# Patient Record
Sex: Female | Born: 1965 | Race: White | Hispanic: No | Marital: Married | Smoking: Never smoker
Health system: Southern US, Community
[De-identification: ages and names within clinical notes are randomized; demographics above are authoritative.]

## PROBLEM LIST (undated history)

## (undated) DIAGNOSIS — H409 Unspecified glaucoma: Secondary | ICD-10-CM

## (undated) DIAGNOSIS — G4730 Sleep apnea, unspecified: Secondary | ICD-10-CM

## (undated) DIAGNOSIS — K589 Irritable bowel syndrome without diarrhea: Secondary | ICD-10-CM

## (undated) DIAGNOSIS — J309 Allergic rhinitis, unspecified: Secondary | ICD-10-CM

## (undated) DIAGNOSIS — E119 Type 2 diabetes mellitus without complications: Secondary | ICD-10-CM

## (undated) DIAGNOSIS — S83209A Unspecified tear of unspecified meniscus, current injury, unspecified knee, initial encounter: Secondary | ICD-10-CM

## (undated) DIAGNOSIS — Z1371 Encounter for nonprocreative screening for genetic disease carrier status: Secondary | ICD-10-CM

## (undated) DIAGNOSIS — I10 Essential (primary) hypertension: Secondary | ICD-10-CM

## (undated) DIAGNOSIS — M1990 Unspecified osteoarthritis, unspecified site: Secondary | ICD-10-CM

## (undated) DIAGNOSIS — T8859XA Other complications of anesthesia, initial encounter: Secondary | ICD-10-CM

## (undated) DIAGNOSIS — R928 Other abnormal and inconclusive findings on diagnostic imaging of breast: Secondary | ICD-10-CM

## (undated) DIAGNOSIS — D131 Benign neoplasm of stomach: Secondary | ICD-10-CM

## (undated) DIAGNOSIS — Z9889 Other specified postprocedural states: Secondary | ICD-10-CM

## (undated) DIAGNOSIS — R112 Nausea with vomiting, unspecified: Secondary | ICD-10-CM

## (undated) DIAGNOSIS — Z8041 Family history of malignant neoplasm of ovary: Secondary | ICD-10-CM

## (undated) DIAGNOSIS — T4145XA Adverse effect of unspecified anesthetic, initial encounter: Secondary | ICD-10-CM

## (undated) DIAGNOSIS — Z8489 Family history of other specified conditions: Secondary | ICD-10-CM

## (undated) DIAGNOSIS — Z9289 Personal history of other medical treatment: Secondary | ICD-10-CM

## (undated) DIAGNOSIS — E782 Mixed hyperlipidemia: Secondary | ICD-10-CM

## (undated) HISTORY — PX: COLONOSCOPY: SHX174

## (undated) HISTORY — DX: Mixed hyperlipidemia: E78.2

## (undated) HISTORY — PX: GANGLION CYST EXCISION: SHX1691

## (undated) HISTORY — DX: Irritable bowel syndrome, unspecified: K58.9

## (undated) HISTORY — DX: Type 2 diabetes mellitus without complications: E11.9

## (undated) HISTORY — DX: Other abnormal and inconclusive findings on diagnostic imaging of breast: R92.8

## (undated) HISTORY — DX: Family history of malignant neoplasm of ovary: Z80.41

## (undated) HISTORY — DX: Unspecified osteoarthritis, unspecified site: M19.90

## (undated) HISTORY — DX: Personal history of other medical treatment: Z92.89

## (undated) HISTORY — DX: Encounter for nonprocreative screening for genetic disease carrier status: Z13.71

## (undated) HISTORY — PX: DILATION AND CURETTAGE OF UTERUS: SHX78

---

## 2005-05-24 ENCOUNTER — Ambulatory Visit: Payer: Self-pay | Admitting: Otolaryngology

## 2009-06-07 ENCOUNTER — Ambulatory Visit: Payer: Self-pay | Admitting: Orthopedic Surgery

## 2010-06-05 DIAGNOSIS — R928 Other abnormal and inconclusive findings on diagnostic imaging of breast: Secondary | ICD-10-CM

## 2010-06-05 HISTORY — DX: Other abnormal and inconclusive findings on diagnostic imaging of breast: R92.8

## 2013-03-03 ENCOUNTER — Ambulatory Visit: Payer: Self-pay | Admitting: Gastroenterology

## 2014-07-17 ENCOUNTER — Ambulatory Visit: Payer: Self-pay | Admitting: Otolaryngology

## 2014-08-11 ENCOUNTER — Ambulatory Visit: Payer: Self-pay | Admitting: Otolaryngology

## 2014-11-05 ENCOUNTER — Encounter: Payer: Self-pay | Admitting: *Deleted

## 2014-11-06 ENCOUNTER — Ambulatory Visit: Payer: 59 | Admitting: Certified Registered"

## 2014-11-06 ENCOUNTER — Encounter
Admission: RE | Disposition: A | Discharge status: Home / Self Care | Payer: Self-pay | Source: Ambulatory Visit | Attending: Gastroenterology

## 2014-11-06 ENCOUNTER — Encounter: Payer: Self-pay | Admitting: *Deleted

## 2014-11-06 ENCOUNTER — Ambulatory Visit
Admission: RE | Admit: 2014-11-06 | Discharge: 2014-11-06 | Disposition: A | Discharge status: Home / Self Care | Payer: 59 | Source: Ambulatory Visit | Attending: Gastroenterology | Admitting: Gastroenterology

## 2014-11-06 DIAGNOSIS — G4730 Sleep apnea, unspecified: Secondary | ICD-10-CM | POA: Insufficient documentation

## 2014-11-06 DIAGNOSIS — K219 Gastro-esophageal reflux disease without esophagitis: Secondary | ICD-10-CM | POA: Diagnosis not present

## 2014-11-06 DIAGNOSIS — K317 Polyp of stomach and duodenum: Secondary | ICD-10-CM | POA: Insufficient documentation

## 2014-11-06 DIAGNOSIS — E119 Type 2 diabetes mellitus without complications: Secondary | ICD-10-CM | POA: Diagnosis not present

## 2014-11-06 DIAGNOSIS — I10 Essential (primary) hypertension: Secondary | ICD-10-CM | POA: Diagnosis not present

## 2014-11-06 DIAGNOSIS — Z79899 Other long term (current) drug therapy: Secondary | ICD-10-CM | POA: Diagnosis not present

## 2014-11-06 DIAGNOSIS — R1084 Generalized abdominal pain: Secondary | ICD-10-CM | POA: Diagnosis present

## 2014-11-06 HISTORY — DX: Unspecified osteoarthritis, unspecified site: M19.90

## 2014-11-06 HISTORY — DX: Sleep apnea, unspecified: G47.30

## 2014-11-06 HISTORY — PX: ESOPHAGOGASTRODUODENOSCOPY: SHX5428

## 2014-11-06 HISTORY — DX: Essential (primary) hypertension: I10

## 2014-11-06 HISTORY — DX: Type 2 diabetes mellitus without complications: E11.9

## 2014-11-06 LAB — POCT PREGNANCY, URINE: Preg Test, Ur: NEGATIVE

## 2014-11-06 SURGERY — EGD (ESOPHAGOGASTRODUODENOSCOPY)
Anesthesia: General

## 2014-11-06 MED ORDER — SODIUM CHLORIDE 0.9 % IV SOLN: INTRAVENOUS | Status: DC

## 2014-11-06 MED ORDER — GLYCOPYRROLATE 0.2 MG/ML IJ SOLN
INTRAMUSCULAR | Status: DC | PRN
  Administered 2014-11-06: 0.2 mg via INTRAVENOUS

## 2014-11-06 MED ORDER — PROPOFOL 10 MG/ML IV BOLUS
INTRAVENOUS | Status: DC | PRN
  Administered 2014-11-06: 30 mg via INTRAVENOUS
  Administered 2014-11-06: 70 mg via INTRAVENOUS

## 2014-11-06 MED ORDER — FENTANYL CITRATE (PF) 100 MCG/2ML IJ SOLN
INTRAMUSCULAR | Status: DC | PRN
  Administered 2014-11-06: 50 ug via INTRAVENOUS

## 2014-11-06 MED ORDER — MIDAZOLAM HCL 5 MG/5ML IJ SOLN
INTRAMUSCULAR | Status: DC | PRN
  Administered 2014-11-06: 1 mg via INTRAVENOUS

## 2014-11-06 MED ORDER — PROPOFOL INFUSION 10 MG/ML OPTIME
INTRAVENOUS | Status: DC | PRN
  Administered 2014-11-06: 120 ug/kg/min via INTRAVENOUS

## 2014-11-06 MED ORDER — SODIUM CHLORIDE 0.9 % IV SOLN
INTRAVENOUS | Status: DC
  Administered 2014-11-06: 10:00:00 via INTRAVENOUS

## 2014-11-06 NOTE — Transfer of Care (Signed)
Immediate Anesthesia Transfer of Care Note  Patient: Teresa Moran  Procedure(s) Performed: Procedure(s) with comments: ESOPHAGOGASTRODUODENOSCOPY (EGD) (N/A) - With Small Bowel Biopsies  Patient Location: Endoscopy Unit  Anesthesia Type:General  Level of Consciousness: awake and alert   Airway & Oxygen Therapy: Patient Spontanous Breathing and Patient connected to nasal cannula oxygen  Post-op Assessment: Report given to RN  Post vital signs: Reviewed  Last Vitals:  Filed Vitals:   11/06/14 1043  BP: 115/83  Pulse: 109  Temp: 36.1 C  Resp: 16    Complications: No apparent anesthesia complications

## 2014-11-06 NOTE — Op Note (Signed)
[facility] Gastroenterology Patient Name: Teresa Moran Procedure Date: 11/06/2014 10:18 AM MRN: [id number] Account #: [id number] Date of Birth: [birth year] Admit Type: Outpatient Age: 49 Room: [facility] ENDO ROOM 1 Gender: Female Note Status: Finalized Procedure:         Upper GI endoscopy Indications:       Generalized abdominal pain, Positive celiac serologies Patient Profile:   This is a 49 year old female. Providers:         Gerrit Heck. Rayann Heman, MD Referring MD:      Vevelyn Francois (Referring MD) Medicines:         Propofol per Anesthesia Complications:     No immediate complications. Procedure:         Pre-Anesthesia Assessment:                    - Prior to the procedure, a History and Physical was                     performed, and patient medications and allergies were                     reviewed. The patient is competent. The risks and benefits                     of the procedure and the sedation options and risks were                     discussed with the patient. All questions were answered                     and informed consent was obtained. Patient identification                     and proposed procedure were verified by the physician and                     the nurse in the pre-procedure area. Mental Status                     Examination: alert and oriented. Airway Examination:                     normal oropharyngeal airway and neck mobility. Respiratory                     Examination: clear to auscultation. CV Examination: RRR,                     no murmurs, no S3 or S4. Prophylactic Antibiotics: The                     patient does not require prophylactic antibiotics. Prior                     Anticoagulants: The patient has taken no previous                     anticoagulant or antiplatelet agents. ASA Grade                     Assessment: III - A patient with severe systemic disease.                     After reviewing the risks and  benefits,  the patient was                     deemed in satisfactory condition to undergo the procedure.                     The anesthesia plan was to use monitored anesthesia care                     (MAC). Immediately prior to administration of medications,                     the patient was re-assessed for adequacy to receive                     sedatives. The heart rate, respiratory rate, oxygen                     saturations, blood pressure, adequacy of pulmonary                     ventilation, and response to care were monitored                     throughout the procedure. The physical status of the                     patient was re-assessed after the procedure.                    - Prior to the procedure, a History and Physical was                     performed, and patient medications, allergies and                     sensitivities were reviewed. The patient's tolerance of                     previous anesthesia was reviewed.                    After obtaining informed consent, the endoscope was passed                     under direct vision. Throughout the procedure, the                     patient's blood pressure, pulse, and oxygen saturations                     were monitored continuously. The Endoscope was introduced                     through the mouth, and advanced to the second part of                     duodenum. The upper GI endoscopy was accomplished without                     difficulty. The patient tolerated the procedure well. Findings:      The esophagus was normal.      A few 2 to 4 mm sessile polyps were found in the gastric body. Biopsies       were taken with a cold forceps for histology.      The  examined duodenum was normal.      Multiple biopsies were obtained with cold forceps for histology randomly       in the duodenal bulb and in the 2nd part of the duodenum. Impression:        - Normal esophagus.                    - A few gastric polyps. Biopsied.                     - Normal examined duodenum.                    - Multiple biopsies were obtained in the duodenal bulb and                     in the 2nd part of the duodenum. Recommendation:    - Observe patient in GI recovery unit.                    - Resume regular diet.                    - Continue present medications.                    - Await pathology results.                    - The findings and recommendations were discussed with the                     patient.                    - The findings and recommendations were discussed with the                     patient's family. Procedure Code(s): --- Professional ---                    [phone], Esophagogastroduodenoscopy, flexible, transoral;                     with biopsy, single or multiple CPT copyright 2014 American Medical Association. All rights reserved. The codes documented in this report are preliminary and upon coder review may  be revised to meet current compliance requirements. Mellody Life, MD 11/06/2014 10:40:18 AM This report has been signed electronically. Number of Addenda: 0 Note Initiated On: 11/06/2014 10:18 AM      [facility]

## 2014-11-06 NOTE — H&P (Signed)
  Primary Care Physician:  No PCP Per Patient  Pre-Procedure History & Physical: HPI:  Teresa Moran is a 49 y.o. female is here for an endoscopy.   Past Medical History  Diagnosis Date  . Sleep apnea   . Diabetes mellitus without complication   . Hypertension   . Arthritis   . GERD (gastroesophageal reflux disease)     History reviewed. No pertinent past surgical history.  Prior to Admission medications   Medication Sig Start Date End Date Taking? Authorizing Provider  Biotin 10 MG TABS Take 1 tablet by mouth daily.   Yes Historical Provider, MD  [employer] D3 1000 UNITS capsule Take 2,000 Units by mouth daily.  08/19/14  Yes Historical Provider, MD  dicyclomine (BENTYL) 10 MG capsule Take 1 capsule by mouth 3 (three) times daily as needed. 09/30/14  Yes Historical Provider, MD  levocetirizine (XYZAL) 5 MG tablet Take 5 mg by mouth every evening.   Yes Historical Provider, MD  lisinopril-hydrochlorothiazide (PRINZIDE,ZESTORETIC) 20-12.5 MG per tablet Take 2 tablets by mouth daily.   Yes Historical Provider, MD  meloxicam (MOBIC) 15 MG tablet Take 15 mg by mouth daily.   Yes Historical Provider, MD  naproxen sodium (ANAPROX) 220 MG tablet Take 220 mg by mouth 2 (two) times daily as needed (pain).   Yes Historical Provider, MD  norethindrone-ethinyl estradiol (MICROGESTIN,JUNEL,LOESTRIN) 1-20 MG-MCG tablet Take 1 tablet by mouth daily.   Yes Historical Provider, MD  Probiotic Product (PROBIOTIC DAILY PO) Take 1 tablet by mouth daily.   Yes Historical Provider, MD    Allergies as of 10/13/2014  . (Not on File)    History reviewed. No pertinent family history.  History   Social History  . Marital Status: Married    Spouse Name: N/A  . Number of Children: N/A  . Years of Education: N/A   Occupational History  . Not on file.   Social History Main Topics  . Smoking status: Never Smoker   . Smokeless tobacco: Not on file  . Alcohol Use: No  . Drug Use: No  . Sexual Activity: Not  on file   Other Topics Concern  . Not on file   Social History Narrative     Physical Exam: BP 152/97 mmHg  Pulse 92  Temp(Src) 98.4 F (36.9 C) (Tympanic)  Resp 18  Ht 5\' 1"  (1.549 m)  Wt 200 lb (90.719 kg)  BMI 37.81 kg/m2  SpO2 99%  LMP 11/06/2014 General:   Alert,  pleasant and cooperative in NAD Head:  Normocephalic and atraumatic. Neck:  Supple; no masses or thyromegaly. Lungs:  Clear throughout to auscultation.    Heart:  Regular rate and rhythm. Abdomen:  Soft, nontender and nondistended. Normal bowel sounds, without guarding, and without rebound.   Neurologic:  Alert and  oriented x4;  grossly normal neurologically.  Impression/Plan: Teresa Moran is here for an endoscopy to be performed for positive celiac serolgies  Risks, benefits, limitations, and alternatives regarding  endoscopy have been reviewed with the patient.  Questions have been answered.  All parties agreeable.   Josefine Class, MD  11/06/2014, 10:23 AM

## 2014-11-06 NOTE — Anesthesia Preprocedure Evaluation (Signed)
Anesthesia Evaluation  Patient identified by MRN, date of birth, ID band Patient awake    Reviewed: Allergy & Precautions, NPO status , Patient's Chart, lab work & pertinent test results  History of Anesthesia Complications Negative for: history of anesthetic complications  Airway Mallampati: III       Dental no notable dental hx.    Pulmonary sleep apnea ,          Cardiovascular hypertension, Pt. on medications Normal cardiovascular exam    Neuro/Psych negative neurological ROS  negative psych ROS   GI/Hepatic Neg liver ROS, GERD-  ,  Endo/Other  diabetes  Renal/GU negative Renal ROS  negative genitourinary   Musculoskeletal  (+) Arthritis -, Osteoarthritis,    Abdominal (+) + obese,   Peds negative pediatric ROS (+)  Hematology negative hematology ROS (+)   Anesthesia Other Findings   Reproductive/Obstetrics negative OB ROS                             Anesthesia Physical Anesthesia Plan  ASA: III  Anesthesia Plan: General   Post-op Pain Management:    Induction: Intravenous  Airway Management Planned:   Additional Equipment:   Intra-op Plan:   Post-operative Plan:   Informed Consent: I have reviewed the patients History and Physical, chart, labs and discussed the procedure including the risks, benefits and alternatives for the proposed anesthesia with the patient or authorized representative who has indicated his/her understanding and acceptance.     Plan Discussed with: CRNA  Anesthesia Plan Comments:         Anesthesia Quick Evaluation

## 2014-11-06 NOTE — Discharge Instructions (Signed)

## 2014-11-06 NOTE — Anesthesia Postprocedure Evaluation (Signed)
  Anesthesia Post-op Note  Patient: Teresa Moran  Procedure(s) Performed: Procedure(s) with comments: ESOPHAGOGASTRODUODENOSCOPY (EGD) (N/A) - With Small Bowel Biopsies  Anesthesia type:General  Patient location: PACU  Post pain: Pain level controlled  Post assessment: Post-op Vital signs reviewed, Patient's Cardiovascular Status Stable, Respiratory Function Stable, Patent Airway and No signs of Nausea or vomiting  Post vital signs: Reviewed and stable  Last Vitals:  Filed Vitals:   11/06/14 1050  BP: 130/90  Pulse:   Temp:   Resp:     Level of consciousness: awake, alert  and patient cooperative  Complications: No apparent anesthesia complications

## 2014-11-10 LAB — SURGICAL PATHOLOGY

## 2014-11-11 ENCOUNTER — Encounter: Payer: Self-pay | Admitting: Gastroenterology

## 2016-01-17 DIAGNOSIS — Z9289 Personal history of other medical treatment: Secondary | ICD-10-CM

## 2016-01-17 HISTORY — DX: Personal history of other medical treatment: Z92.89

## 2016-01-27 DIAGNOSIS — Z9289 Personal history of other medical treatment: Secondary | ICD-10-CM

## 2016-01-27 HISTORY — DX: Personal history of other medical treatment: Z92.89

## 2016-02-04 DIAGNOSIS — Z1371 Encounter for nonprocreative screening for genetic disease carrier status: Secondary | ICD-10-CM

## 2016-02-04 HISTORY — DX: Encounter for nonprocreative screening for genetic disease carrier status: Z13.71

## 2016-08-18 ENCOUNTER — Other Ambulatory Visit: Payer: Self-pay | Admitting: Obstetrics and Gynecology

## 2016-08-18 ENCOUNTER — Telehealth: Payer: Self-pay

## 2016-08-18 MED ORDER — LEVOCETIRIZINE DIHYDROCHLORIDE 5 MG PO TABS: 5.0000 mg | ORAL_TABLET | Freq: Every evening | ORAL | 1 refills | Status: DC

## 2016-08-18 NOTE — Telephone Encounter (Signed)
Pt calling requesting refill on allergy med. levitrazine 5mg  and hasn't heard back on [facility] email.  Needs at least a 30d supply until she can be seen. [phone].

## 2016-08-18 NOTE — Telephone Encounter (Signed)
Rx RF sent to [facility]. Pt doesn't need to be seen until 8/18. RN to notify pt.

## 2016-11-24 ENCOUNTER — Other Ambulatory Visit: Payer: Self-pay | Admitting: Obstetrics and Gynecology

## 2016-11-28 ENCOUNTER — Telehealth: Payer: Self-pay | Admitting: Obstetrics and Gynecology

## 2016-11-28 ENCOUNTER — Other Ambulatory Visit: Payer: Self-pay | Admitting: Obstetrics and Gynecology

## 2016-11-28 NOTE — Progress Notes (Unsigned)
cmp

## 2016-11-28 NOTE — Telephone Encounter (Signed)
Pt is schedule for For annual 01/23/17. Pt is requesting lab work sent to [facility] . Please advise

## 2016-11-28 NOTE — Telephone Encounter (Signed)
Is pt coming to our office or other [place]? RN to confirm for order input.

## 2016-11-29 ENCOUNTER — Other Ambulatory Visit: Payer: Self-pay | Admitting: Obstetrics and Gynecology

## 2016-11-29 DIAGNOSIS — E119 Type 2 diabetes mellitus without complications: Secondary | ICD-10-CM

## 2016-11-29 DIAGNOSIS — E782 Mixed hyperlipidemia: Secondary | ICD-10-CM

## 2016-11-29 NOTE — Telephone Encounter (Signed)
Pt will come here for labs and is aware to be fasting and to sched appt.

## 2016-11-29 NOTE — Progress Notes (Signed)
Pt due for annual labs. Has annual appt 81/8.

## 2016-11-29 NOTE — Telephone Encounter (Signed)
Done

## 2016-11-29 NOTE — Telephone Encounter (Signed)
LM c female to have pt call me.

## 2016-12-29 ENCOUNTER — Encounter: Payer: Self-pay | Admitting: Obstetrics and Gynecology

## 2017-01-01 ENCOUNTER — Other Ambulatory Visit: Payer: Self-pay | Admitting: Obstetrics and Gynecology

## 2017-01-01 DIAGNOSIS — E119 Type 2 diabetes mellitus without complications: Secondary | ICD-10-CM

## 2017-01-01 DIAGNOSIS — Z0000 Encounter for general adult medical examination without abnormal findings: Secondary | ICD-10-CM

## 2017-01-01 NOTE — Progress Notes (Signed)
Q6 mo labs for DM/annual. HgA1C and lipids done at work 12/21/16. HgA1C=5.7%. Total chol-200, HDL-51, TG-89, LDL-131.

## 2017-01-16 ENCOUNTER — Other Ambulatory Visit: Payer: 59

## 2017-01-16 DIAGNOSIS — E119 Type 2 diabetes mellitus without complications: Secondary | ICD-10-CM

## 2017-01-16 DIAGNOSIS — E782 Mixed hyperlipidemia: Secondary | ICD-10-CM

## 2017-01-16 DIAGNOSIS — Z0000 Encounter for general adult medical examination without abnormal findings: Secondary | ICD-10-CM

## 2017-01-17 ENCOUNTER — Telehealth: Payer: Self-pay | Admitting: Obstetrics and Gynecology

## 2017-01-17 DIAGNOSIS — E119 Type 2 diabetes mellitus without complications: Secondary | ICD-10-CM

## 2017-01-17 DIAGNOSIS — I10 Essential (primary) hypertension: Secondary | ICD-10-CM

## 2017-01-17 LAB — LIPID PANEL
CHOL/HDL RATIO: 3.7 ratio (ref 0.0–4.4)
CHOLESTEROL TOTAL: 188 mg/dL (ref 100–199)
HDL: 51 mg/dL (ref >39)
LDL CALC: 120 mg/dL — AB (ref 0–99)
TRIGLYCERIDES: 83 mg/dL (ref 0–149)
VLDL Cholesterol Cal: 17 mg/dL (ref 5–40)

## 2017-01-17 LAB — COMPREHENSIVE METABOLIC PANEL
ALBUMIN: 4.3 g/dL (ref 3.5–5.5)
ALK PHOS: 72 IU/L (ref 39–117)
ALT: 11 IU/L (ref 0–32)
AST: 11 IU/L (ref 0–40)
Albumin/Globulin Ratio: 2.4 — ABNORMAL HIGH (ref 1.2–2.2)
BILIRUBIN TOTAL: 0.4 mg/dL (ref 0.0–1.2)
BUN / CREAT RATIO: 16 (ref 9–23)
BUN: 13 mg/dL (ref 6–24)
CHLORIDE: 102 mmol/L (ref 96–106)
CO2: 26 mmol/L (ref 20–29)
Calcium: 9.4 mg/dL (ref 8.7–10.2)
Creatinine, Ser: 0.82 mg/dL (ref 0.57–1.00)
GFR calc non Af Amer: 83 mL/min/{1.73_m2} (ref >59)
GFR, EST AFRICAN AMERICAN: 95 mL/min/{1.73_m2} (ref >59)
GLOBULIN, TOTAL: 1.8 g/dL (ref 1.5–4.5)
Glucose: 106 mg/dL — ABNORMAL HIGH (ref 65–99)
Potassium: 4 mmol/L (ref 3.5–5.2)
SODIUM: 142 mmol/L (ref 134–144)
TOTAL PROTEIN: 6.1 g/dL (ref 6.0–8.5)

## 2017-01-17 LAB — MICROALBUMIN / CREATININE URINE RATIO
Creatinine, Urine: 149.8 mg/dL
MICROALBUM., U, RANDOM: 3 ug/mL
Microalb/Creat Ratio: 2 mg/g creat (ref 0.0–30.0)

## 2017-01-17 LAB — HEMOGLOBIN A1C
Est. average glucose Bld gHb Est-mCnc: 123 mg/dL
Hgb A1c MFr Bld: 5.9 % — ABNORMAL HIGH (ref 4.8–5.6)

## 2017-01-17 MED ORDER — LISINOPRIL-HYDROCHLOROTHIAZIDE 20-12.5 MG PO TABS: 1.0000 | ORAL_TABLET | Freq: Every day | ORAL | 1 refills | Status: DC

## 2017-01-17 MED ORDER — PRAVASTATIN SODIUM 20 MG PO TABS: 20.0000 mg | ORAL_TABLET | Freq: Every day | ORAL | 1 refills | Status: DC

## 2017-01-17 MED ORDER — METFORMIN HCL 500 MG PO TABS: 500.0000 mg | ORAL_TABLET | Freq: Every day | ORAL | 1 refills | Status: DC

## 2017-01-17 NOTE — Telephone Encounter (Signed)
LM with lab results. Cont current meds. Metformin 500 mg daily, pravastatin 20 mg daily, lisinopril/HTCZ. Rx RF eRxd. Rechk labs in 6 months. F/u next wk at annual.

## 2017-01-19 ENCOUNTER — Other Ambulatory Visit: Payer: Self-pay | Admitting: Obstetrics and Gynecology

## 2017-01-19 NOTE — Telephone Encounter (Signed)
Please advise for refill. Thank you.  

## 2017-01-23 ENCOUNTER — Ambulatory Visit (INDEPENDENT_AMBULATORY_CARE_PROVIDER_SITE_OTHER): Payer: 59 | Admitting: Obstetrics and Gynecology

## 2017-01-23 ENCOUNTER — Encounter: Payer: Self-pay | Admitting: Obstetrics and Gynecology

## 2017-01-23 VITALS — BP 120/70 | HR 93 | Ht 61.0 in | Wt 176.0 lb

## 2017-01-23 DIAGNOSIS — I10 Essential (primary) hypertension: Secondary | ICD-10-CM | POA: Insufficient documentation

## 2017-01-23 DIAGNOSIS — Z01419 Encounter for gynecological examination (general) (routine) without abnormal findings: Secondary | ICD-10-CM

## 2017-01-23 DIAGNOSIS — E782 Mixed hyperlipidemia: Secondary | ICD-10-CM

## 2017-01-23 DIAGNOSIS — Z8041 Family history of malignant neoplasm of ovary: Secondary | ICD-10-CM

## 2017-01-23 DIAGNOSIS — M79643 Pain in unspecified hand: Secondary | ICD-10-CM | POA: Insufficient documentation

## 2017-01-23 DIAGNOSIS — Z1239 Encounter for other screening for malignant neoplasm of breast: Secondary | ICD-10-CM

## 2017-01-23 DIAGNOSIS — E119 Type 2 diabetes mellitus without complications: Secondary | ICD-10-CM

## 2017-01-23 DIAGNOSIS — J309 Allergic rhinitis, unspecified: Secondary | ICD-10-CM | POA: Insufficient documentation

## 2017-01-23 DIAGNOSIS — M79673 Pain in unspecified foot: Secondary | ICD-10-CM | POA: Insufficient documentation

## 2017-01-23 DIAGNOSIS — Z1231 Encounter for screening mammogram for malignant neoplasm of breast: Secondary | ICD-10-CM

## 2017-01-23 DIAGNOSIS — K589 Irritable bowel syndrome without diarrhea: Secondary | ICD-10-CM | POA: Insufficient documentation

## 2017-01-23 NOTE — Progress Notes (Signed)
Chief Complaint  Patient presents with  . Gynecologic Exam    annual    HPI:      [name] is a 51 y.o. No obstetric history on file. who LMP was Patient's last menstrual period was 11/06/2014., presents today for her annual examination.  Her menses are absent. She does not have intermenstrual bleeding. She does have occas vasomotor sx.   Sex activity: single partner, contraception - post menopausal status. She does not have vaginal dryness.  Last Pap: January 17, 2016  Results were: no abnormalities /neg HPV DNA.  Hx of STDs: none  Last mammogram: January 27, 2016  Results were: normal--routine follow-up in 12 months There is a FH of breast cancer in her pat cousin, genetic testing not indicated. There is a FH of ovarian cancer in her cousin and possibly mat aunt. Vistaseq testing was neg last yr. The patient does do self-breast exams.  Colonoscopy: colonoscopy 4 years ago with abnormalities. Repeat due after 5 yrs.   Tobacco use: The patient denies current or previous tobacco use. Alcohol use: none Exercise: moderately active  She does get adequate calcium and Vitamin D in her diet.  She has HTN and is doing well on lisinopril/HCTZ. She has type 2 DM with recent labs showing HgA1C of 5.9%. She gets eye exam yearly but didn't go last yr. She plans to go this yr.  Lipids WNL on pravastatin 20 mg daily. No side effects with meds. She plans to lose some more wt for [facility] and her health. She would like to ultimately be able to come off metformin. Repeat labs due 6/ mos. Meds already eRxd for 6 months.  Recent Results (from the past 2160 hour(s))  Urine Microalbumin w/creat. ratio     Status: None   Collection Time: 01/16/17  8:31 AM  Result Value Ref Range   Creatinine, Urine 149.8 Not Estab. mg/dL   Albumin, Urine 3.0 Not Estab. ug/mL   Microalb/Creat Ratio 2.0 0.0 - 30.0 mg/g creat  Lipid panel     Status: Abnormal   Collection Time: 01/16/17  8:34 AM  Result Value  Ref Range   Cholesterol, Total 188 100 - 199 mg/dL   Triglycerides 83 0 - 149 mg/dL   HDL 51 >39 mg/dL   VLDL Cholesterol Cal 17 5 - 40 mg/dL   LDL Calculated 120 (H) 0 - 99 mg/dL   Chol/HDL Ratio 3.7 0.0 - 4.4 ratio    Comment:                                   T. Chol/HDL Ratio                                             Men  Women                               1/2 Avg.Risk  3.4    3.3                                   Avg.Risk  5.0    4.4  2X Avg.Risk  9.6    7.1                                3X Avg.Risk 23.4   11.0   Hemoglobin A1c     Status: Abnormal   Collection Time: 01/16/17  8:34 AM  Result Value Ref Range   Hgb A1c MFr Bld 5.9 (H) 4.8 - 5.6 %    Comment:          Prediabetes: 5.7 - 6.4          Diabetes: >6.4          Glycemic control for adults with diabetes: <7.0    Est. average glucose Bld gHb Est-mCnc 123 mg/dL  Comprehensive metabolic panel     Status: Abnormal   Collection Time: 01/16/17  8:34 AM  Result Value Ref Range   Glucose 106 (H) 65 - 99 mg/dL   BUN 13 6 - 24 mg/dL   Creatinine, Ser 0.82 0.57 - 1.00 mg/dL   GFR calc non Af Amer 83 >59 mL/min/1.73   GFR calc Af Amer 95 >59 mL/min/1.73   BUN/Creatinine Ratio 16 9 - 23   Sodium 142 134 - 144 mmol/L   Potassium 4.0 3.5 - 5.2 mmol/L   Chloride 102 96 - 106 mmol/L   CO2 26 20 - 29 mmol/L   Calcium 9.4 8.7 - 10.2 mg/dL   Total Protein 6.1 6.0 - 8.5 g/dL   Albumin 4.3 3.5 - 5.5 g/dL   Globulin, Total 1.8 1.5 - 4.5 g/dL   Albumin/Globulin Ratio 2.4 (H) 1.2 - 2.2   Bilirubin Total 0.4 0.0 - 1.2 mg/dL   Alkaline Phosphatase 72 39 - 117 IU/L   AST 11 0 - 40 IU/L   ALT 11 0 - 32 IU/L     Past Medical History:  Diagnosis Date  . Abnormal mammogram 2012   resolved  . Arthritis   . Diabetes mellitus without complication ([place])   . Family history of ovarian cancer   . GERD (gastroesophageal reflux disease)   . History of mammogram 01/27/2016   Birads 1  . History of  Papanicolaou smear of cervix 01/17/2016   NIL/NEG  . Hypertension   . IBS (irritable bowel syndrome)   . Mixed hyperlipidemia   . Osteoarthritis   . Sleep apnea   . Testing of female for genetic disease carrier status 02/2016   VISTASEQ Neg  . Type 2 diabetes mellitus ([place])     Past Surgical History:  Procedure Laterality Date  . COLONOSCOPY  07/2009;2014   benign polyps neg  . ESOPHAGOGASTRODUODENOSCOPY N/A 11/06/2014   Procedure: ESOPHAGOGASTRODUODENOSCOPY (EGD);  Surgeon: Josefine Class, MD;  [facility] ENDOSCOPY;  Service: Endoscopy;  Laterality: N/A;  With Small Bowel Biopsies    Family History  Problem Relation Age of Onset  . Hypertension Mother   . Diabetes Father   . Heart disease Father   . Hypertension Father   . Breast cancer Cousin 77  . Ovarian cancer Maternal Aunt 70  . Ovarian cancer Cousin 63    Social History   Social History  . Marital status: Married    Spouse name: N/A  . Number of children: N/A  . Years of education: N/A   Occupational History  . Not on file.   Social History Main Topics  . Smoking status: Never Smoker  . Smokeless tobacco: Never Used  . Alcohol  use No  . Drug use: No  . Sexual activity: Not on file   Other Topics Concern  . Not on file   Social History Narrative  . No narrative on file     Current Outpatient Prescriptions:  .  Biotin 10 MG TABS, Take 1 tablet by mouth daily., Disp: , Rfl:  .  Cholecalciferol (D3-1000) 1000 units capsule, d3-1000 1000 unit caps, Disp: , Rfl:  .  diclofenac (VOLTAREN) 75 MG EC tablet, diclofenac sodium 75 mg tablet,delayed release  TAKE 1 TABLET(S) TWICE A DAY BY ORAL ROUTE., Disp: , Rfl:  .  dicyclomine (BENTYL) 10 MG capsule, Take 1 capsule by mouth 3 (three) times daily as needed., Disp: , Rfl: 1 .  levocetirizine (XYZAL) 5 MG tablet, Take 1 tablet (5 mg total) by mouth every evening., Disp: 90 tablet, Rfl: 1 .  lisinopril-hydrochlorothiazide (PRINZIDE,ZESTORETIC) 20-12.5 MG  tablet, Take 1 tablet by mouth daily., Disp: 90 tablet, Rfl: 1 .  meloxicam (MOBIC) 15 MG tablet, TAKE 1 TABLET BY MOUTH ONCE DAILY, Disp: 90 tablet, Rfl: 0 .  metFORMIN (GLUCOPHAGE) 500 MG tablet, Take 1 tablet (500 mg total) by mouth daily., Disp: 90 tablet, Rfl: 1 .  naproxen sodium (ANAPROX) 220 MG tablet, Take 220 mg by mouth 2 (two) times daily as needed (pain)., Disp: , Rfl:  .  pravastatin (PRAVACHOL) 20 MG tablet, Take 1 tablet (20 mg total) by mouth daily., Disp: 90 tablet, Rfl: 1 .  Probiotic Product (PROBIOTIC DAILY PO), Take 1 tablet by mouth daily., Disp: , Rfl:    ROS:  Review of Systems  Constitutional: Negative for fatigue, fever and unexpected weight change.  Respiratory: Negative for cough, shortness of breath and wheezing.   Cardiovascular: Negative for chest pain, palpitations and leg swelling.  Gastrointestinal: Positive for abdominal pain. Negative for blood in stool, constipation, diarrhea, nausea and vomiting.  Endocrine: Negative for cold intolerance, heat intolerance and polyuria.  Genitourinary: Negative for dyspareunia, dysuria, flank pain, frequency, genital sores, hematuria, menstrual problem, pelvic pain, urgency, vaginal bleeding, vaginal discharge and vaginal pain.  Musculoskeletal: Positive for arthralgias and joint swelling. Negative for back pain and myalgias.  Skin: Negative for rash.  Neurological: Negative for dizziness, syncope, light-headedness, numbness and headaches.  Hematological: Negative for adenopathy.  Psychiatric/Behavioral: Negative for agitation, confusion, sleep disturbance and suicidal ideas. The patient is not nervous/anxious.      Objective: BP 120/70   Pulse 93   Ht 5\' 1"  (1.549 m)   Wt 176 lb (79.8 kg)   LMP 11/06/2014 Comment: urine preg neg  BMI 33.25 kg/m    Physical Exam  Constitutional: She is oriented to person, place, and time. She appears well-developed and well-nourished.  Genitourinary: Vagina normal and uterus  normal. There is no rash or tenderness on the right labia. There is no rash or tenderness on the left labia. No erythema or tenderness in the vagina. No vaginal discharge found. Right adnexum does not display mass and does not display tenderness. Left adnexum does not display mass and does not display tenderness. Cervix does not exhibit motion tenderness or polyp. Uterus is not enlarged or tender.  Neck: Normal range of motion. No thyromegaly present.  Cardiovascular: Normal rate, regular rhythm and normal heart sounds.   No murmur heard. Pulmonary/Chest: Effort normal and breath sounds normal. Right breast exhibits no mass, no nipple discharge, no skin change and no tenderness. Left breast exhibits no mass, no nipple discharge, no skin change and no tenderness.  Abdominal: Soft.  There is no tenderness. There is no guarding.  Musculoskeletal: Normal range of motion.  Neurological: She is alert and oriented to person, place, and time. No cranial nerve deficit.  Psychiatric: She has a normal mood and affect. Her behavior is normal.  Vitals reviewed.   Assessment/Plan:  Encounter for annual routine gynecological examination  Screening for breast cancer - Pt to sched mammo. - Plan: MM DIGITAL SCREENING BILATERAL  Mixed hyperlipidemia - Controlled with pravastatin. REchk in 6 months. Diet/wt loss.  Essential hypertension - Controlled with HCTZ/lisinopril.  Diabetes mellitus without complication ([place]) - Cont diet/exercise/wt loss as well as metformin.  Family history of ovarian cancer - Neg cancer genetic testing. No further screening options at this time.    Pt's meds already eRxd for 6 months. Lab rechk in 6 months--orders already in computer.           GYN counsel mammography screening, adequate intake of calcium and vitamin D, diet and exercise    F/U  Return in about 1 year (around 01/23/2018).  Alicia B. Copland, PA-C 01/23/2017 2:26 PM

## 2017-02-12 ENCOUNTER — Encounter: Payer: Self-pay | Admitting: Obstetrics and Gynecology

## 2017-02-13 ENCOUNTER — Encounter: Payer: Self-pay | Admitting: Obstetrics and Gynecology

## 2017-02-13 ENCOUNTER — Other Ambulatory Visit: Payer: Self-pay | Admitting: Student

## 2017-02-13 ENCOUNTER — Other Ambulatory Visit: Payer: Self-pay | Admitting: Obstetrics and Gynecology

## 2017-02-13 DIAGNOSIS — R1010 Upper abdominal pain, unspecified: Secondary | ICD-10-CM

## 2017-02-13 DIAGNOSIS — R109 Unspecified abdominal pain: Secondary | ICD-10-CM

## 2017-02-13 DIAGNOSIS — R110 Nausea: Secondary | ICD-10-CM

## 2017-02-13 MED ORDER — LEVOCETIRIZINE DIHYDROCHLORIDE 5 MG PO TABS: 5.0000 mg | ORAL_TABLET | Freq: Every evening | ORAL | 1 refills | Status: DC

## 2017-02-13 NOTE — Progress Notes (Signed)
Rx RF. Forgot to send in at annual.

## 2017-02-22 ENCOUNTER — Ambulatory Visit
Admission: RE | Admit: 2017-02-22 | Discharge: 2017-02-22 | Disposition: A | Discharge status: Home / Self Care | Payer: 59 | Source: Ambulatory Visit | Attending: Student | Admitting: Student

## 2017-02-22 DIAGNOSIS — R110 Nausea: Secondary | ICD-10-CM | POA: Insufficient documentation

## 2017-02-22 DIAGNOSIS — R109 Unspecified abdominal pain: Secondary | ICD-10-CM

## 2017-02-22 DIAGNOSIS — R1010 Upper abdominal pain, unspecified: Secondary | ICD-10-CM | POA: Diagnosis not present

## 2017-02-22 DIAGNOSIS — K8020 Calculus of gallbladder without cholecystitis without obstruction: Secondary | ICD-10-CM | POA: Insufficient documentation

## 2017-03-02 ENCOUNTER — Ambulatory Visit: Payer: Self-pay | Admitting: General Surgery

## 2017-03-02 NOTE — H&P (Signed)
PATIENT PROFILE: Teresa Moran is a 51 y.o. female who presents to the Clinic for consultation at the request of Dr. Wynetta Emery for evaluation of cholelithiasis with abdominal pain.  PCP:  [place]  HISTORY OF PRESENT ILLNESS: Teresa Moran reports that in the past years she has been feeling abdominal pain on the upper quadrants. Sometimes pain start on the back and radiates to bilateral upper quadrants and sometimes start on the upper quadrants and radiates to the back and right scapula area. Patient associate pain with fatty food as cheese, guacamole and pizza. Pain medications are not relieving pain. Pain only goes away with time. Denies nausea or vomiting. Refers episodes episodes of diarrhea. Denies fever or chills. Denies episodes of jaundice.    PROBLEM LIST:        Problem List  Date Reviewed: 04/23/2014         Noted   Allergic rhinitis Unknown      GENERAL REVIEW OF SYSTEMS:   General ROS: negative for - chills, fatigue, fever, weight gain or weight loss Allergy and Immunology ROS: negative for - hives. Positive for occasional seasonal allergies and sinusitis  Hematological and Lymphatic ROS: negative for - bleeding problems or bruising, negative for palpable nodes Endocrine ROS: negative for - heat or cold intolerance, hair changes Respiratory ROS: negative for - cough, shortness of breath or wheezing Cardiovascular ROS: no chest pain or palpitations GI ROS:  See HPI for positive findings.  Musculoskeletal ROS: positve for joint stiffness Neurological ROS: negative for - confusion, syncope Dermatological ROS: negative for pruritus and rash  MEDICATIONS: CurrentMedications        Current Outpatient Prescriptions  Medication Sig Dispense Refill  . biotin 10 mg Tab Take by mouth.    . cholecalciferol (VITAMIN D3) 1,000 unit capsule Take 1,000 Units by mouth 2 (two) times daily as needed.   4  . levocetirizine (XYZAL) 5 MG tablet  Take 5 mg by mouth every evening.    Marland Kitchen lisinopril-hydrochlorothiazide (PRINZIDE,ZESTORETIC) 20-12.5 mg tablet     . meloxicam (MOBIC) 15 MG tablet Take 15 mg by mouth once daily.    . norethindrone-ethinyl estradiol (MICROGESTIN,LOESTRIN,JUNEL 1/20) 1-20 mg-mcg tablet     . SACCHAROMYCES BOULARDII (PROBIOTIC, S.BOULARDII, ORAL) Take by mouth.     No current facility-administered medications for this visit.       ALLERGIES: Patient has no known allergies.  PAST MEDICAL HISTORY:     Past Medical History:  Diagnosis Date  . Allergic rhinitis   . Cataract cortical, senile   . Diabetes mellitus type 2, uncomplicated (CMS-HCC)   . Fundic gland polyps of stomach, benign 11/06/2014  . Glaucoma (increased eye pressure)   . Hyperlipemia, unspecified, unspecified   . Hypertension   . Osteoarthritis   . Osteoporosis, post-menopausal   . Sleep apnea     PAST SURGICAL HISTORY:      Past Surgical History:  Procedure Laterality Date  . COLONOSCOPY  03/03/2013   PH Adenomatous polyps.  Repeat 5 Years.  . COLONOSCOPY  07/19/2009   Jill Side- adenomatous polyps  . DILATION AND CURETTAGE, DIAGNOSTIC / THERAPEUTIC  1987  . EGD  11/06/2014   Fundic gland polyp/Negative for Celiac/No Repeat/MGR  . EXCISION GANGLION CYST WRIST PRIMARY Left 1985     FAMILY HISTORY:      Family History  Problem Relation Age of Onset  . Heart disease Father   . Colon polyps Mother   . Diabetes Unknown   . High blood  pressure (Hypertension) Unknown   . Arthritis Unknown   . Gout Unknown   . Colon cancer Neg Hx      SOCIAL HISTORY: Social History        Social History  . Marital status: Married    Spouse name: N/A  . Number of children: N/A  . Years of education: N/A       Social History Main Topics  . Smoking status: Never Smoker  . Smokeless tobacco: Never Used  . Alcohol use Yes  . Drug use: No  . Sexual activity: Not Asked       Other  Topics Concern  . None      Social History Narrative  . None    PHYSICAL EXAM:    Vitals:   03/02/17 1000  BP: 128/88  Pulse: 89  Temp: 36.6 C (97.9 F)   Body mass index is 32.88 kg/m. Weight: 78.9 kg (174 lb)   General Appearance:    Alert, cooperative, no distress, appears stated age  Head:     Atraumatic, normocephalic  Eyes:   Anciteric, no erythema, no secretions  Neck:   Supple, symmetrical, no JVD, no palpable lymph nodes  Mouth:   Lips, mucosa, and tongue normal;   Lungs:     Clear to auscultation bilaterally, respirations unlabored   Heart:    Regular rate and rhythm, S1 and S2 normal, no murmur, rub   or gallop  Abdomen:     Soft, non-tender, bowel sounds active all four quadrants,    no masses, no organomegaly  Extremities:   Extremities normal, atraumatic, no cyanosis or edema  Skin:   Skin color, texture, turgor normal, no rashes or lesions   Neurologic:   Grossly intact.    REVIEW OF DATA: I have reviewed the following data today:      No visits with results within 3 Month(s) from this visit.  Latest known visit with results is:  Orders Only on 09/30/2014  Component Date Value  . White Blood Cell Count -* 09/30/2014 5.4   . Red Blood Cell Count - L* 09/30/2014 4.00   . Hemoglobin - [facility] 09/30/2014 13.0   . Hematocrit - [facility] 09/30/2014 37.9   . MCV - [facility] 09/30/2014 95   . MCH  - [facility] 09/30/2014 32.5   . MCHC - [facility] 09/30/2014 34.3   . RDW - [facility] 09/30/2014 13.2   . Platelet Count - [facility] 09/30/2014 320   . Neutrophils - [facility] 09/30/2014 58   . Lymphs % 09/30/2014 32   . Monocytes - [facility] 09/30/2014 6   . Eos - [facility] 09/30/2014 3   . Basos - [facility] 09/30/2014 1   . Neutrophils (Absolute) -* 09/30/2014 3.2   . Lymphs (Absolute) - [facility] 09/30/2014 1.7   . Monocytes(Absolute) - [facility]* 09/30/2014 0.3   . Eos (Absolute) - [facility] 09/30/2014 0.1   . Baso (Absolute) - [facility] 09/30/2014 0.0   . Immature Granulocytes -  * 09/30/2014 0   . Immature Grans (Abs) - L* 09/30/2014 0.0   . Glucose Random - [facility] 09/30/2014 204*  . Blood Urea Nitrogen - [facility]* 09/30/2014 8   . Creatinine  - [facility] 09/30/2014 0.78   . eGFR If NonAfricn Am - L* 09/30/2014 90   . eGFR If Africn Am - [facility] 09/30/2014 103   . Bun/Creatinine Ratio - L* 09/30/2014 10   . Sodium - [facility] 09/30/2014 137   . Potassium - [facility] 09/30/2014 3.8   .  Chloride - [facility] 09/30/2014 97   . Carbon Dioxide - [facility] 09/30/2014 23   . Calcium - [facility] 09/30/2014 9.4   . Protein Total - [facility] 09/30/2014 6.1   . Albumin - [facility] 09/30/2014 4.1   . Globulin, Total - [facility] 09/30/2014 2.0   . A/G Ratio - [facility] 09/30/2014 2.1   . Bilirubin Total - [facility] 09/30/2014 <0.2   . Alkaline Phosphatase - L* 09/30/2014 44   . Ast - [facility] 09/30/2014 22   . Alanine Aminotransferase* 09/30/2014 26   . Folate (Folic Acid), Ser* 22/97/9892 17.9   . TSH - [facility] 09/30/2014 3.690   . t-Transglutaminase (tTG)* 09/30/2014 5*  . Sed Rate - [facility] 09/30/2014 6   . Lipase - [facility] 09/30/2014 23   . Immunoglobulin A, Qn, Se* 09/30/2014 116      ASSESSMENT: Teresa Moran is a 51 y.o. female presenting for consultation for cholelithiasis. At the moment of evaluation the patient presents with different types of abdominal pain. From generalized all quadrant pain to more specific upper quadrants pain radiating to the back that are associated with fatty food. Patient also with intermittent episodes of diarrhea. I oriented the patient about the sonographic findings of gallstones and sludge and discuss with her the symptoms. I oriented the patient that she has good chances of improving the specific pain, specially after eating fatty food, but the other more generalized pain and the diarrhea may or may not change after surgery. I oriented the patient about the surgical alternatives, we talked about laparoscopy vs open cholecystectomy and about the risk of surgery such  as wound infection, bleeding, bile duct injury and others. Also discussed with patient the risk of not having surgery such as episodes of cholecystitis, choledocholithiasis, and/or gallstone pancreatitis.      PLAN: 1. Laparoscopic cholecystectomy  2. Orientation and patient information given regarding low fat diet and pre/post operative recommendations.   Patient verbalized understanding, all questions were answered, and were agreeable with the plan outlined above.    Herbert Pun, MD  Electronically signed by Herbert Pun, MD

## 2017-03-12 ENCOUNTER — Encounter: Payer: Self-pay | Admitting: Obstetrics and Gynecology

## 2017-03-12 ENCOUNTER — Ambulatory Visit (INDEPENDENT_AMBULATORY_CARE_PROVIDER_SITE_OTHER): Payer: 59 | Admitting: Obstetrics and Gynecology

## 2017-03-12 VITALS — BP 108/60 | HR 94 | Ht 61.0 in | Wt 174.0 lb

## 2017-03-12 DIAGNOSIS — N3000 Acute cystitis without hematuria: Secondary | ICD-10-CM

## 2017-03-12 DIAGNOSIS — R350 Frequency of micturition: Secondary | ICD-10-CM | POA: Diagnosis not present

## 2017-03-12 LAB — POCT URINALYSIS DIPSTICK
Bilirubin, UA: NEGATIVE
GLUCOSE UA: NEGATIVE
Ketones, UA: NEGATIVE
NITRITE UA: NEGATIVE
SPEC GRAV UA: 1.02 (ref 1.010–1.025)
UROBILINOGEN UA: NEGATIVE U/dL — AB
pH, UA: 6 (ref 5.0–8.0)

## 2017-03-12 MED ORDER — CIPROFLOXACIN HCL 500 MG PO TABS: 500.0000 mg | ORAL_TABLET | Freq: Two times a day (BID) | ORAL | 0 refills | Status: DC

## 2017-03-12 NOTE — Progress Notes (Signed)
Chief Complaint  Patient presents with  . Urinary Tract Infection    Painful urination/ feeling not emptying all the way/low abd.pain    HPI:      [name] is a 51 y.o. G2P1010 who LMP was Patient's last menstrual period was 11/06/2014., presents today for UTI sx of urinary frequency/urgency, dysuria for the past wk. She was seen last wk at urgent care for sinusitis sx and given amox. Pt then took diflucan for yeast vag sx with some relief, but urin sx present now. No vag sx. No fevers. She is having back pain but is also have gallbladder issues and trying to get to end of month for sched lap chole.    Past Medical History:  Diagnosis Date  . Abnormal mammogram 2012   resolved  . Arthritis   . Diabetes mellitus without complication ([place])   . Family history of ovarian cancer   . GERD (gastroesophageal reflux disease)   . History of mammogram 01/27/2016   [facility] 1  . History of Papanicolaou smear of cervix 01/17/2016   NIL/NEG  . Hypertension   . IBS (irritable bowel syndrome)   . Mixed hyperlipidemia   . Osteoarthritis   . Sleep apnea   . Testing of female for genetic disease carrier status 02/2016   VISTASEQ Neg  . Type 2 diabetes mellitus ([place])     Past Surgical History:  Procedure Laterality Date  . COLONOSCOPY  07/2009;2014   benign polyps neg  . ESOPHAGOGASTRODUODENOSCOPY N/A 11/06/2014   Procedure: ESOPHAGOGASTRODUODENOSCOPY (EGD);  Surgeon: Josefine Class, MD;  Location: [facility] ENDOSCOPY;  Service: Endoscopy;  Laterality: N/A;  With Small Bowel Biopsies    Family History  Problem Relation Age of Onset  . Hypertension Mother   . Diabetes Father   . Heart disease Father   . Hypertension Father   . Breast cancer Cousin 30  . Ovarian cancer Maternal Aunt 70  . Ovarian cancer Cousin 83    Social History   Social History  . Marital status: Married    Spouse name: N/A  . Number of children: N/A  . Years of education: N/A   Occupational History    . Not on file.   Social History Main Topics  . Smoking status: Never Smoker  . Smokeless tobacco: Never Used  . Alcohol use No  . Drug use: No  . Sexual activity: Yes    Birth control/ protection: None   Other Topics Concern  . Not on file   Social History Narrative  . No narrative on file     Current Outpatient Prescriptions:  .  Biotin 10 MG TABS, Take 1 tablet by mouth daily., Disp: , Rfl:  .  Cholecalciferol (D3-1000) 1000 units capsule, d3-1000 1000 unit caps, Disp: , Rfl:  .  dicyclomine (BENTYL) 10 MG capsule, Take 1 capsule by mouth 3 (three) times daily as needed., Disp: , Rfl: 1 .  levocetirizine (XYZAL) 5 MG tablet, Take 1 tablet (5 mg total) by mouth every evening., Disp: 90 tablet, Rfl: 1 .  lisinopril-hydrochlorothiazide (PRINZIDE,ZESTORETIC) 20-12.5 MG tablet, Take 1 tablet by mouth daily., Disp: 90 tablet, Rfl: 1 .  meloxicam (MOBIC) 15 MG tablet, TAKE 1 TABLET BY MOUTH ONCE DAILY, Disp: 90 tablet, Rfl: 0 .  metFORMIN (GLUCOPHAGE) 500 MG tablet, Take 1 tablet (500 mg total) by mouth daily., Disp: 90 tablet, Rfl: 1 .  naproxen sodium (ANAPROX) 220 MG tablet, Take 220 mg by mouth 2 (two) times daily  as needed (pain)., Disp: , Rfl:  .  pravastatin (PRAVACHOL) 20 MG tablet, Take 1 tablet (20 mg total) by mouth daily., Disp: 90 tablet, Rfl: 1 .  Probiotic Product (PROBIOTIC DAILY PO), Take 1 tablet by mouth daily., Disp: , Rfl:  .  ciprofloxacin (CIPRO) 500 MG tablet, Take 1 tablet (500 mg total) by mouth 2 (two) times daily., Disp: 6 tablet, Rfl: 0   ROS:  Review of Systems  Constitutional: Negative for fever.  Gastrointestinal: Negative for blood in stool, constipation, diarrhea, nausea and vomiting.  Genitourinary: Positive for dysuria, frequency and urgency. Negative for dyspareunia, flank pain, hematuria, vaginal bleeding, vaginal discharge and vaginal pain.  Musculoskeletal: Positive for back pain.  Skin: Negative for rash.     OBJECTIVE:   Vitals:  BP  108/60 (BP Location: Left Arm, Patient Position: Sitting, Cuff Size: Normal)   Pulse 94   Ht 5\' 1"  (1.549 m)   Wt 174 lb (78.9 kg)   LMP 11/06/2014   BMI 32.88 kg/m   Physical Exam  Constitutional: She is oriented to person, place, and time and well-developed, well-nourished, and in no distress.  Abdominal: There is CVA tenderness.  MILD CVA TENDERNESS BILAT  Neurological: She is alert and oriented to person, place, and time.  Psychiatric: Affect and judgment normal.  Vitals reviewed.   Results: Results for orders placed or performed in visit on 03/12/17 (from the past 24 hour(s))  POCT urinalysis dipstick     Status: Abnormal   Collection Time: 03/12/17  4:46 PM  Result Value Ref Range   Color, UA Gold    Clarity, UA Clear    Glucose, UA Neg    Bilirubin, UA Neg    Ketones, UA Neg    Spec Grav, UA 1.020 1.010 - 1.025   Blood, UA Small    pH, UA 6.0 5.0 - 8.0   Protein, UA 1+    Urobilinogen, UA negative (A) 0.2 or 1.0 E.U./dL   Nitrite, UA Neg    Leukocytes, UA Moderate (2+) (A) Negative     Assessment/Plan: Acute cystitis without hematuria - Rx cipro. Check C&S. F/u prn.  - Plan: ciprofloxacin (CIPRO) 500 MG tablet, Urine Culture  Frequency of urination - Plan: POCT urinalysis dipstick, Urine Culture    Meds ordered this encounter  Medications  . ciprofloxacin (CIPRO) 500 MG tablet    Sig: Take 1 tablet (500 mg total) by mouth 2 (two) times daily.    Dispense:  6 tablet    Refill:  0      Return if symptoms worsen or fail to improve.  Carys Malina B. Magaret Justo, PA-C 03/12/2017 5:07 PM

## 2017-03-15 LAB — URINE CULTURE

## 2017-03-26 ENCOUNTER — Encounter
Admission: RE | Admit: 2017-03-26 | Discharge: 2017-03-26 | Disposition: A | Discharge status: Home / Self Care | Payer: 59 | Source: Ambulatory Visit | Attending: General Surgery | Admitting: General Surgery

## 2017-03-26 DIAGNOSIS — Z01810 Encounter for preprocedural cardiovascular examination: Secondary | ICD-10-CM | POA: Insufficient documentation

## 2017-03-26 DIAGNOSIS — E119 Type 2 diabetes mellitus without complications: Secondary | ICD-10-CM | POA: Insufficient documentation

## 2017-03-26 DIAGNOSIS — I10 Essential (primary) hypertension: Secondary | ICD-10-CM | POA: Insufficient documentation

## 2017-03-26 HISTORY — DX: Unspecified tear of unspecified meniscus, current injury, unspecified knee, initial encounter: S83.209A

## 2017-03-26 HISTORY — DX: Family history of other specified conditions: Z84.89

## 2017-03-26 HISTORY — DX: Other complications of anesthesia, initial encounter: T88.59XA

## 2017-03-26 HISTORY — DX: Other specified postprocedural states: Z98.890

## 2017-03-26 HISTORY — DX: Other specified postprocedural states: R11.2

## 2017-03-26 HISTORY — DX: Adverse effect of unspecified anesthetic, initial encounter: T41.45XA

## 2017-03-26 NOTE — Patient Instructions (Signed)
Your procedure is scheduled on: 04-02-17 MONDAY Report to Same Day Surgery 2nd floor medical mall [facility] Entrance-take elevator on left to 2nd floor.  Check in with surgery information desk.) To find out your arrival time please call [phone] between 1PM - 3PM on 03-30-17 FRIDAY  Remember: Instructions that are not followed completely may result in serious medical risk, up to and including death, or upon the discretion of your surgeon and anesthesiologist your surgery may need to be rescheduled.    _x___ 1. Do not eat food after midnight the night before your procedure. NO GUM CHEWING OR HARD CANDIES.   You may drink clear liquids up to 2 hours before you are scheduled to arrive at the hospital for your procedure.  Do not drink clear liquids within 2 hours of your scheduled arrival to the hospital.  Clear liquids include  --Water or Apple juice without pulp  --Clear carbohydrate beverage such as ClearFast or Gatorade  --Black Coffee or Clear Tea (No milk, no creamers, do not add anything to the coffee or Tea  Type 1 and type 2 diabetics should only drink water.     __x__ 2. No Alcohol for 24 hours before or after surgery.   __x__3. No Smoking for 24 prior to surgery.   ____  4. Bring all medications with you on the day of surgery if instructed.    __x__ 5. Notify your doctor if there is any change in your medical condition     (cold, fever, infections).     Do not wear jewelry, make-up, hairpins, clips or nail polish.  Do not wear lotions, powders, or perfumes. You may wear deodorant.  Do not shave 48 hours prior to surgery. Men may shave face and neck.  Do not bring valuables to the hospital.    [facility] is not responsible for any belongings or valuables.               Contacts, dentures or bridgework may not be worn into surgery.  Leave your suitcase in the car. After surgery it may be brought to your room.  For patients admitted to the hospital, discharge time is  determined by your treatment team.   Patients discharged the day of surgery will not be allowed to drive home.  You will need someone to drive you home and stay with you the night of your procedure.    Please read over the following fact sheets that you were given:     _x___ [address] WITH A SMALL SIP OF WATER. These include:  1. NONE  2.  3.  4.  5.  6.  ____Fleets enema or Magnesium Citrate as directed.   _x___ Use CHG Soap or sage wipes as directed on instruction sheet   ____ Use inhalers on the day of surgery and bring to hospital day of surgery  _X___ Stop Metformin and Janumet 2 days prior to surgery-LAST DOSE OF METFORMIN ON Friday, OCTOBER 26TH    ____ Take 1/2 of usual insulin dose the night before surgery and none on the morning surgery.   ____ Follow recommendations from Cardiologist, Pulmonologist or PCP regarding stopping Aspirin, Coumadin, Plavix ,Eliquis, Effient, or Pradaxa, and Pletal.  X____Stop Anti-inflammatories such as Advil, Aleve, Ibuprofen, Motrin, NAPROXEN, MOBIC, Naprosyn, Goodies powders or aspirin products. OK to take Tylenol    _x___ Stop supplements until after surgery-STOP BIOTIN NOW-MAY RESUME AFTER SURGERY   ____ Bring C-Pap to  the hospital.

## 2017-03-28 ENCOUNTER — Encounter
Admission: RE | Admit: 2017-03-28 | Discharge: 2017-03-28 | Disposition: A | Discharge status: Home / Self Care | Payer: 59 | Source: Ambulatory Visit | Attending: General Surgery | Admitting: General Surgery

## 2017-03-28 DIAGNOSIS — I10 Essential (primary) hypertension: Secondary | ICD-10-CM | POA: Diagnosis not present

## 2017-03-28 DIAGNOSIS — Z01810 Encounter for preprocedural cardiovascular examination: Secondary | ICD-10-CM | POA: Diagnosis not present

## 2017-03-28 DIAGNOSIS — E119 Type 2 diabetes mellitus without complications: Secondary | ICD-10-CM | POA: Diagnosis not present

## 2017-04-02 ENCOUNTER — Ambulatory Visit: Admission: RE | Admit: 2017-04-02 | Payer: 59 | Source: Ambulatory Visit

## 2017-04-02 ENCOUNTER — Encounter: Admission: RE | Payer: Self-pay | Source: Ambulatory Visit

## 2017-04-02 SURGERY — LAPAROSCOPIC CHOLECYSTECTOMY WITH INTRAOPERATIVE CHOLANGIOGRAM
Anesthesia: Choice

## 2017-04-11 ENCOUNTER — Encounter: Payer: Self-pay | Admitting: Obstetrics and Gynecology

## 2017-05-30 ENCOUNTER — Other Ambulatory Visit: Payer: Self-pay | Admitting: Obstetrics and Gynecology

## 2017-05-30 DIAGNOSIS — E119 Type 2 diabetes mellitus without complications: Secondary | ICD-10-CM

## 2017-06-29 ENCOUNTER — Other Ambulatory Visit: Payer: Self-pay | Admitting: Obstetrics and Gynecology

## 2017-06-29 DIAGNOSIS — E119 Type 2 diabetes mellitus without complications: Secondary | ICD-10-CM

## 2017-07-20 ENCOUNTER — Other Ambulatory Visit: Payer: Self-pay | Admitting: Obstetrics and Gynecology

## 2017-07-20 ENCOUNTER — Telehealth: Payer: Self-pay | Admitting: Obstetrics and Gynecology

## 2017-07-20 DIAGNOSIS — E119 Type 2 diabetes mellitus without complications: Secondary | ICD-10-CM

## 2017-07-20 DIAGNOSIS — I10 Essential (primary) hypertension: Secondary | ICD-10-CM

## 2017-07-20 NOTE — Telephone Encounter (Signed)
Patient needs refills on Metformin, Lavitriozine (allergy medicine) and Flonase.  Pharmacy is [facility].  Patient to request these refills on MyChart but doesn't feel it was successful.  cb [phone] or [phone]

## 2017-07-20 NOTE — Telephone Encounter (Signed)
Called pt no answer no voicemail

## 2017-07-20 NOTE — Telephone Encounter (Signed)
RN to notify pt fasting labs due. Orders in system and released.

## 2017-07-23 NOTE — Telephone Encounter (Signed)
Left voice message.

## 2017-07-26 NOTE — Telephone Encounter (Signed)
Patient is calling about schedule labs. Pt is schedule

## 2017-07-26 NOTE — Telephone Encounter (Signed)
lmtrc

## 2017-07-26 NOTE — Telephone Encounter (Signed)
Pt called triage today regarding medication refill request. She did not state which medication but noted that she did receive a call last week.   Pt cb# [phone]

## 2017-07-27 ENCOUNTER — Other Ambulatory Visit: Payer: Self-pay | Admitting: Obstetrics and Gynecology

## 2017-07-27 DIAGNOSIS — I10 Essential (primary) hypertension: Secondary | ICD-10-CM

## 2017-07-27 DIAGNOSIS — E119 Type 2 diabetes mellitus without complications: Secondary | ICD-10-CM

## 2017-07-30 NOTE — Telephone Encounter (Signed)
Please advise 

## 2017-07-31 ENCOUNTER — Ambulatory Visit: Payer: 59

## 2017-07-31 DIAGNOSIS — Z1239 Encounter for other screening for malignant neoplasm of breast: Secondary | ICD-10-CM

## 2017-07-31 DIAGNOSIS — I10 Essential (primary) hypertension: Secondary | ICD-10-CM

## 2017-07-31 DIAGNOSIS — E119 Type 2 diabetes mellitus without complications: Secondary | ICD-10-CM

## 2017-08-01 ENCOUNTER — Telehealth: Payer: Self-pay | Admitting: Obstetrics and Gynecology

## 2017-08-01 DIAGNOSIS — I10 Essential (primary) hypertension: Secondary | ICD-10-CM

## 2017-08-01 DIAGNOSIS — E119 Type 2 diabetes mellitus without complications: Secondary | ICD-10-CM

## 2017-08-01 LAB — COMPREHENSIVE METABOLIC PANEL
A/G RATIO: 2.2 (ref 1.2–2.2)
ALT: 8 IU/L (ref 0–32)
AST: 10 IU/L (ref 0–40)
Albumin: 4.4 g/dL (ref 3.5–5.5)
Alkaline Phosphatase: 68 IU/L (ref 39–117)
BILIRUBIN TOTAL: 0.4 mg/dL (ref 0.0–1.2)
BUN/Creatinine Ratio: 19 (ref 9–23)
BUN: 18 mg/dL (ref 6–24)
CHLORIDE: 106 mmol/L (ref 96–106)
CO2: 22 mmol/L (ref 20–29)
Calcium: 9.8 mg/dL (ref 8.7–10.2)
Creatinine, Ser: 0.93 mg/dL (ref 0.57–1.00)
GFR calc Af Amer: 82 mL/min/{1.73_m2} (ref >59)
GFR calc non Af Amer: 71 mL/min/{1.73_m2} (ref >59)
GLUCOSE: 120 mg/dL — AB (ref 65–99)
Globulin, Total: 2 g/dL (ref 1.5–4.5)
POTASSIUM: 4.6 mmol/L (ref 3.5–5.2)
Sodium: 145 mmol/L — ABNORMAL HIGH (ref 134–144)
TOTAL PROTEIN: 6.4 g/dL (ref 6.0–8.5)

## 2017-08-01 LAB — LIPID PANEL
CHOLESTEROL TOTAL: 200 mg/dL — AB (ref 100–199)
Chol/HDL Ratio: 4 ratio (ref 0.0–4.4)
HDL: 50 mg/dL (ref >39)
LDL Calculated: 129 mg/dL — ABNORMAL HIGH (ref 0–99)
Triglycerides: 107 mg/dL (ref 0–149)
VLDL CHOLESTEROL CAL: 21 mg/dL (ref 5–40)

## 2017-08-01 LAB — HEMOGLOBIN A1C
Est. average glucose Bld gHb Est-mCnc: 120 mg/dL
HEMOGLOBIN A1C: 5.8 % — AB (ref 4.8–5.6)

## 2017-08-01 MED ORDER — PRAVASTATIN SODIUM 20 MG PO TABS: 20.0000 mg | ORAL_TABLET | Freq: Every day | ORAL | 1 refills | Status: DC

## 2017-08-01 MED ORDER — METFORMIN HCL 500 MG PO TABS: 500.0000 mg | ORAL_TABLET | Freq: Every evening | ORAL | 1 refills | Status: DC

## 2017-08-01 MED ORDER — LISINOPRIL-HYDROCHLOROTHIAZIDE 20-12.5 MG PO TABS: 1.0000 | ORAL_TABLET | Freq: Every day | ORAL | 1 refills | Status: DC

## 2017-08-01 NOTE — Telephone Encounter (Signed)
LM with results. Cont current meds. Rx RF to [facility]. Rechk labs at 8/19 annual.   Recent Results (from the past 2160 hour(s))  Comprehensive metabolic panel     Status: Abnormal   Collection Time: 07/31/17  8:53 AM  Result Value Ref Range   Glucose 120 (H) 65 - 99 mg/dL   BUN 18 6 - 24 mg/dL   Creatinine, Ser 0.93 0.57 - 1.00 mg/dL   GFR calc non Af Amer 71 >59 mL/min/1.73   GFR calc Af Amer 82 >59 mL/min/1.73   BUN/Creatinine Ratio 19 9 - 23   Sodium 145 (H) 134 - 144 mmol/L   Potassium 4.6 3.5 - 5.2 mmol/L   Chloride 106 96 - 106 mmol/L   CO2 22 20 - 29 mmol/L   Calcium 9.8 8.7 - 10.2 mg/dL   Total Protein 6.4 6.0 - 8.5 g/dL   Albumin 4.4 3.5 - 5.5 g/dL   Globulin, Total 2.0 1.5 - 4.5 g/dL   Albumin/Globulin Ratio 2.2 1.2 - 2.2   Bilirubin Total 0.4 0.0 - 1.2 mg/dL   Alkaline Phosphatase 68 39 - 117 IU/L   AST 10 0 - 40 IU/L   ALT 8 0 - 32 IU/L  Lipid panel     Status: Abnormal   Collection Time: 07/31/17  8:53 AM  Result Value Ref Range   Cholesterol, Total 200 (H) 100 - 199 mg/dL   Triglycerides 107 0 - 149 mg/dL   HDL 50 >39 mg/dL   VLDL Cholesterol Cal 21 5 - 40 mg/dL   LDL Calculated 129 (H) 0 - 99 mg/dL   Chol/HDL Ratio 4.0 0.0 - 4.4 ratio    Comment:                                   T. Chol/HDL Ratio                                             Men  Women                               1/2 Avg.Risk  3.4    3.3                                   Avg.Risk  5.0    4.4                                2X Avg.Risk  9.6    7.1                                3X Avg.Risk 23.4   11.0   Hemoglobin A1c     Status: Abnormal   Collection Time: 07/31/17  8:53 AM  Result Value Ref Range   Hgb A1c MFr Bld 5.8 (H) 4.8 - 5.6 %    Comment:          Prediabetes: 5.7 - 6.4          Diabetes: >6.4          Glycemic  control for adults with diabetes: <7.0    Est. average glucose Bld gHb Est-mCnc 120 mg/dL     Meds ordered this encounter  Medications  .  lisinopril-hydrochlorothiazide (PRINZIDE,ZESTORETIC) 20-12.5 MG tablet    Sig: Take 1 tablet by mouth daily.    Dispense:  90 tablet    Refill:  1    Order Specific Question:   Supervising Provider    Answer:   Gae Dry [id number]  . metFORMIN (GLUCOPHAGE) 500 MG tablet    Sig: Take 1 tablet (500 mg total) by mouth every evening.    Dispense:  90 tablet    Refill:  1    Order Specific Question:   Supervising Provider    Answer:   Gae Dry [id number]  . pravastatin (PRAVACHOL) 20 MG tablet    Sig: Take 1 tablet (20 mg total) by mouth daily.    Dispense:  90 tablet    Refill:  1    Order Specific Question:   Supervising Provider    Answer:   Gae Dry [[id number]]

## 2017-08-02 ENCOUNTER — Encounter: Payer: Self-pay | Admitting: Obstetrics and Gynecology

## 2017-08-02 MED ORDER — MELOXICAM 15 MG PO TABS: ORAL_TABLET | ORAL | 0 refills | Status: DC

## 2017-08-02 MED ORDER — LEVOCETIRIZINE DIHYDROCHLORIDE 5 MG PO TABS: 5.0000 mg | ORAL_TABLET | Freq: Every evening | ORAL | 1 refills | Status: DC

## 2017-08-02 MED ORDER — FLUTICASONE PROPIONATE 50 MCG/ACT NA SUSP: 2.0000 | Freq: Every day | NASAL | 0 refills | Status: DC | PRN

## 2017-09-20 ENCOUNTER — Other Ambulatory Visit: Payer: Self-pay | Admitting: Obstetrics and Gynecology

## 2017-09-20 NOTE — Telephone Encounter (Signed)
Please advise. Thank you

## 2017-09-27 ENCOUNTER — Encounter: Payer: Self-pay | Admitting: Obstetrics and Gynecology

## 2017-12-27 ENCOUNTER — Other Ambulatory Visit: Payer: Self-pay | Admitting: Obstetrics and Gynecology

## 2017-12-27 DIAGNOSIS — E119 Type 2 diabetes mellitus without complications: Secondary | ICD-10-CM

## 2017-12-28 NOTE — Telephone Encounter (Signed)
Please advise. Thank you

## 2017-12-31 ENCOUNTER — Encounter: Payer: Self-pay | Admitting: Obstetrics and Gynecology

## 2018-01-02 ENCOUNTER — Telehealth: Payer: Self-pay | Admitting: Obstetrics and Gynecology

## 2018-01-02 NOTE — Telephone Encounter (Signed)
Pt's labs done 6/19 through work. No other labs needed at 8/19 appt. Pt aware. Will RF meds at appt.

## 2018-01-02 NOTE — Telephone Encounter (Signed)
Please advise 

## 2018-01-02 NOTE — Telephone Encounter (Signed)
Patient is scheduled for annual 8/22.  She wants to have her labs done before her appointment.  Can you put the orders in for her?

## 2018-01-04 ENCOUNTER — Other Ambulatory Visit: Payer: Self-pay | Admitting: Obstetrics and Gynecology

## 2018-01-04 DIAGNOSIS — I10 Essential (primary) hypertension: Secondary | ICD-10-CM

## 2018-01-04 DIAGNOSIS — E119 Type 2 diabetes mellitus without complications: Secondary | ICD-10-CM

## 2018-01-08 ENCOUNTER — Telehealth: Payer: Self-pay

## 2018-01-08 NOTE — Telephone Encounter (Signed)
[facility] home delivery calling for [employer] collaborating MD, NPI#.  Ref # [id number].  [phone]  Information given to pharmacist.

## 2018-01-24 ENCOUNTER — Encounter: Payer: Self-pay | Admitting: Obstetrics and Gynecology

## 2018-01-24 ENCOUNTER — Ambulatory Visit (INDEPENDENT_AMBULATORY_CARE_PROVIDER_SITE_OTHER): Payer: 59 | Admitting: Obstetrics and Gynecology

## 2018-01-24 VITALS — BP 120/70 | HR 95 | Ht 60.0 in | Wt 177.0 lb

## 2018-01-24 DIAGNOSIS — Z1231 Encounter for screening mammogram for malignant neoplasm of breast: Secondary | ICD-10-CM

## 2018-01-24 DIAGNOSIS — E782 Mixed hyperlipidemia: Secondary | ICD-10-CM

## 2018-01-24 DIAGNOSIS — Z8041 Family history of malignant neoplasm of ovary: Secondary | ICD-10-CM

## 2018-01-24 DIAGNOSIS — Z1321 Encounter for screening for nutritional disorder: Secondary | ICD-10-CM | POA: Diagnosis not present

## 2018-01-24 DIAGNOSIS — Z01411 Encounter for gynecological examination (general) (routine) with abnormal findings: Secondary | ICD-10-CM

## 2018-01-24 DIAGNOSIS — I10 Essential (primary) hypertension: Secondary | ICD-10-CM

## 2018-01-24 DIAGNOSIS — Z1239 Encounter for other screening for malignant neoplasm of breast: Secondary | ICD-10-CM

## 2018-01-24 DIAGNOSIS — Z01419 Encounter for gynecological examination (general) (routine) without abnormal findings: Secondary | ICD-10-CM

## 2018-01-24 DIAGNOSIS — E119 Type 2 diabetes mellitus without complications: Secondary | ICD-10-CM

## 2018-01-24 NOTE — Patient Instructions (Signed)
I value your feedback and entrusting us with your care. If you get a [facility] patient survey, I would appreciate you taking the time to let us know about your experience today. Thank you! 

## 2018-01-24 NOTE — Progress Notes (Addendum)
Chief Complaint  Patient presents with  . Gynecologic Exam  . [employer] employee    HPI:      [name] is a 52 y.o. No obstetric history on file. who LMP was Patient's last menstrual period was 11/06/2014., presents today for her annual examination.  Her menses are absent. She does not have intermenstrual bleeding. She does have occas vasomotor sx.   Sex activity: single partner, contraception - post menopausal status. She does not have vaginal dryness.  Last Pap: January 17, 2016  Results were: no abnormalities /neg HPV DNA.  Hx of STDs: none  Last mammogram: 02/12/17  Results were: normal--routine follow-up in 12 months There is a FH of breast cancer in her pat cousin, genetic testing not indicated. There is a FH of ovarian cancer in her cousin and possibly mat aunt. Vistaseq testing was neg last yr. The patient does do self-breast exams.  Colonoscopy: colonoscopy 5 years ago with abnormalities. Repeat due after 5 yrs. Pt has appt 10/19.  Tobacco use: The patient denies current or previous tobacco use. Alcohol use: none Exercise: moderately active  She does get adequate calcium and Vitamin D in her diet.  She has HTN and is doing well on lisinopril/HCTZ. She has type 2 DM with 6/19 labs (at work) showing HgA1C of 5.8%. She gets eye exam yearly and due later this yr. Lipids WNL on pravastatin 20 mg daily. No side effects with meds. She plans to lose some more wt for [facility] and her health. She would like to ultimately be able to come off metformin. Repeat labs due 11/19. Meds already eRxd till then.   6/19 lab results (pt faxed to me from work health screening). Total chol=176, HDL=44, LDL=110, TG=109. HgA1C=5.8%. Creatinine=0.91, eGFR=72.  [facility] BMI appeal form completed. Pt still having pain with RT meniscus tear and is limited in exercise. Considering surg.  Pt has had issues with gallbladder, trying to control with diet.   Past Medical History:  Diagnosis Date  .  Abnormal mammogram 2012   resolved  . Arthritis   . Complication of anesthesia   . Diabetes mellitus without complication ([place])   . Family history of adverse reaction to anesthesia    mom-hard to wake up, n/v  . Family history of ovarian cancer   . History of mammogram 01/27/2016   Birads 1  . History of Papanicolaou smear of cervix 01/17/2016   NIL/NEG  . Hypertension   . IBS (irritable bowel syndrome)   . Mixed hyperlipidemia   . Osteoarthritis   . PONV (postoperative nausea and vomiting)    n/v  . Sleep apnea    does not use CPAP  . Testing of female for genetic disease carrier status 02/2016   VISTASEQ Neg  . Torn meniscus    RIGHT KNEE-PT STATES IT HAS HEALED BUT STILL HAS WEAKNESS AND PAIN IN KNEE  . Type 2 diabetes mellitus ([place])     Past Surgical History:  Procedure Laterality Date  . COLONOSCOPY  07/2009;2014   benign polyps neg  . DILATION AND CURETTAGE OF UTERUS    . ESOPHAGOGASTRODUODENOSCOPY N/A 11/06/2014   Procedure: ESOPHAGOGASTRODUODENOSCOPY (EGD);  Surgeon: Josefine Class, MD;  Location: [facility] ENDOSCOPY;  Service: Endoscopy;  Laterality: N/A;  With Small Bowel Biopsies  . GANGLION CYST EXCISION Left     Family History  Problem Relation Age of Onset  . Hypertension Mother   . Diabetes Father   . Heart disease Father   .  Hypertension Father   . Breast cancer Cousin 31  . Ovarian cancer Maternal Aunt 70  . Ovarian cancer Cousin 62    Social History   Socioeconomic History  . Marital status: Married    Spouse name: Not on file  . Number of children: Not on file  . Years of education: Not on file  . Highest education level: Not on file  Occupational History  . Not on file  Social Needs  . Financial resource strain: Not on file  . Food insecurity:    Worry: Not on file    Inability: Not on file  . Transportation needs:    Medical: Not on file    Non-medical: Not on file  Tobacco Use  . Smoking status: Never Smoker  . Smokeless tobacco:  Never Used  Substance and Sexual Activity  . Alcohol use: No  . Drug use: No  . Sexual activity: Yes    Birth control/protection: Post-menopausal  Lifestyle  . Physical activity:    Days per week: Not on file    Minutes per session: Not on file  . Stress: Not on file  Relationships  . Social connections:    Talks on phone: Not on file    Gets together: Not on file    Attends religious service: Not on file    Active member of club or organization: Not on file    Attends meetings of clubs or organizations: Not on file    Relationship status: Not on file  . Intimate partner violence:    Fear of current or ex partner: Not on file    Emotionally abused: Not on file    Physically abused: Not on file    Forced sexual activity: Not on file  Other Topics Concern  . Not on file  Social History Narrative  . Not on file     Current Outpatient Medications:  .  BIOTIN PO, Take 2 each by mouth daily., Disp: , Rfl:  .  Cholecalciferol (D 2000) 2000 units TABS, Take 2,000 Units by mouth daily., Disp: , Rfl:  .  fluticasone (FLONASE) 50 MCG/ACT nasal spray, Place 2 sprays into both nostrils daily as needed for allergies or rhinitis., Disp: 48 g, Rfl: 0 .  glucose blood test strip, OneTouch Verio strips, Disp: , Rfl:  .  levocetirizine (XYZAL) 5 MG tablet, Take 1 tablet (5 mg total) by mouth every evening., Disp: 90 tablet, Rfl: 1 .  lisinopril-hydrochlorothiazide (PRINZIDE,ZESTORETIC) 20-12.5 MG tablet, TAKE 1 TABLET BY MOUTH  DAILY, Disp: 90 tablet, Rfl: 1 .  meloxicam (MOBIC) 15 MG tablet, TAKE 1 TABLET BY MOUTH ONCE DAILY AS NEEDED FOR PAIN, Disp: 90 tablet, Rfl: 0 .  metFORMIN (GLUCOPHAGE) 500 MG tablet, TAKE 1 TABLET BY MOUTH  EVERY EVENING, Disp: 90 tablet, Rfl: 0 .  ONETOUCH DELICA LANCETS 01B MISC, OneTouch Delica Lancets 33 gauge, Disp: , Rfl:  .  pravastatin (PRAVACHOL) 20 MG tablet, TAKE 1 TABLET BY MOUTH  DAILY, Disp: 90 tablet, Rfl: 0 .  triamcinolone cream (KENALOG) 0.1 %, APPLY  ON THE SKIN AS DIRECTED TO AFFECTED ELBOWS 1-2 TIMES A DAY AS NEEDED AVOID FACE/GROIN/AXILLA., Disp: , Rfl: 2 .  GAVILYTE-N WITH FLAVOR PACK 420 g solution, TAKE 4,000 MLS BY MOUTH AS DIRECTED TAKE AS DIRECTED FOR COLON PREP., Disp: , Rfl: 0 .  polyethylene glycol-electrolytes (NULYTELY/GOLYTELY) 420 g solution, Take by mouth., Disp: , Rfl:    ROS:  Review of Systems  Constitutional: Negative for fatigue, fever  and unexpected weight change.  Respiratory: Negative for cough, shortness of breath and wheezing.   Cardiovascular: Negative for chest pain, palpitations and leg swelling.  Gastrointestinal: Positive for abdominal pain and nausea. Negative for blood in stool, constipation, diarrhea and vomiting.  Endocrine: Negative for cold intolerance, heat intolerance and polyuria.  Genitourinary: Negative for dyspareunia, dysuria, flank pain, frequency, genital sores, hematuria, menstrual problem, pelvic pain, urgency, vaginal bleeding, vaginal discharge and vaginal pain.  Musculoskeletal: Positive for arthralgias and joint swelling. Negative for back pain and myalgias.  Skin: Negative for rash.  Neurological: Negative for dizziness, syncope, light-headedness, numbness and headaches.  Hematological: Negative for adenopathy.  Psychiatric/Behavioral: Negative for agitation, confusion, sleep disturbance and suicidal ideas. The patient is not nervous/anxious.      Objective: BP 120/70   Pulse 95   Ht 5' (1.524 m)   Wt 177 lb (80.3 kg)   LMP 11/06/2014   BMI 34.57 kg/m    Physical Exam  Constitutional: She is oriented to person, place, and time. She appears well-developed and well-nourished.  Genitourinary: Vagina normal and uterus normal. There is no rash or tenderness on the right labia. There is no rash or tenderness on the left labia. No erythema or tenderness in the vagina. No vaginal discharge found. Right adnexum does not display mass and does not display tenderness. Left adnexum does  not display mass and does not display tenderness. Cervix does not exhibit motion tenderness or polyp. Uterus is not enlarged or tender.  Neck: Normal range of motion. No thyromegaly present.  Cardiovascular: Normal rate, regular rhythm and normal heart sounds.  No murmur heard. Pulmonary/Chest: Effort normal and breath sounds normal. Right breast exhibits no mass, no nipple discharge, no skin change and no tenderness. Left breast exhibits no mass, no nipple discharge, no skin change and no tenderness.  Abdominal: Soft. There is no tenderness. There is no guarding.  Musculoskeletal: Normal range of motion.  Neurological: She is alert and oriented to person, place, and time. No cranial nerve deficit.  Psychiatric: She has a normal mood and affect. Her behavior is normal.  Vitals reviewed.   Assessment/Plan:  Encounter for annual routine gynecological examination  Screening for breast cancer - Pt to sched mammo - Plan: MM DIGITAL SCREENING BILATERAL, MM DIGITAL SCREENING BILATERAL  Mixed hyperlipidemia - Labs due 11/19. Has Rx RF till then. - Plan: Lipid panel, Lipid panel  Diabetes mellitus without complication ([place]) - Labs due 11/19. Has Rx RF till then. Urine due 11/19. See opthalmology yearly. Diet/exercise/wt loss changes.  - Plan: Comprehensive metabolic panel, Lipid panel, Hemoglobin A1c, Microalbumin / creatinine urine ratio, Microalbumin / creatinine urine ratio, Hemoglobin A1c, Lipid panel, Comprehensive metabolic panel  Essential hypertension - Has Rx RF meds till 11/19. Diet/wt loss.  - Plan: Comprehensive metabolic panel, Comprehensive metabolic panel  Family history of ovarian cancer - Vistaseq neg. Nothing further to do at this time.          GYN counsel mammography screening, adequate intake of calcium and vitamin D, diet and exercise    F/U  Return in about 1 year (around 01/25/2019).  Sulma Ruffino B. Sukhman Kocher, PA-C 01/24/2018 11:57 AM

## 2018-01-24 NOTE — Addendum Note (Signed)
Addended by: Ardeth Perfect B on: 10/20/6158 12:03 PM   Modules accepted: Orders

## 2018-02-18 ENCOUNTER — Encounter: Payer: Self-pay | Admitting: Obstetrics and Gynecology

## 2018-02-21 ENCOUNTER — Other Ambulatory Visit: Payer: Self-pay | Admitting: Obstetrics and Gynecology

## 2018-02-21 DIAGNOSIS — E119 Type 2 diabetes mellitus without complications: Secondary | ICD-10-CM

## 2018-02-22 NOTE — Telephone Encounter (Signed)
Please advise 

## 2018-02-26 ENCOUNTER — Other Ambulatory Visit: Payer: Self-pay | Admitting: Obstetrics and Gynecology

## 2018-02-26 DIAGNOSIS — E119 Type 2 diabetes mellitus without complications: Secondary | ICD-10-CM

## 2018-02-26 NOTE — Telephone Encounter (Signed)
Please advise 

## 2018-03-08 ENCOUNTER — Encounter: Payer: Self-pay | Admitting: *Deleted

## 2018-03-11 ENCOUNTER — Encounter: Payer: Self-pay | Admitting: *Deleted

## 2018-03-11 ENCOUNTER — Ambulatory Visit: Payer: 59 | Admitting: Anesthesiology

## 2018-03-11 ENCOUNTER — Encounter
Admission: RE | Disposition: A | Discharge status: Home / Self Care | Payer: Self-pay | Source: Ambulatory Visit | Attending: Unknown Physician Specialty

## 2018-03-11 ENCOUNTER — Ambulatory Visit
Admission: RE | Admit: 2018-03-11 | Discharge: 2018-03-11 | Disposition: A | Discharge status: Home / Self Care | Payer: 59 | Source: Ambulatory Visit | Attending: Unknown Physician Specialty | Admitting: Unknown Physician Specialty

## 2018-03-11 DIAGNOSIS — J309 Allergic rhinitis, unspecified: Secondary | ICD-10-CM | POA: Insufficient documentation

## 2018-03-11 DIAGNOSIS — Z7984 Long term (current) use of oral hypoglycemic drugs: Secondary | ICD-10-CM | POA: Insufficient documentation

## 2018-03-11 DIAGNOSIS — Z1211 Encounter for screening for malignant neoplasm of colon: Secondary | ICD-10-CM | POA: Insufficient documentation

## 2018-03-11 DIAGNOSIS — K640 First degree hemorrhoids: Secondary | ICD-10-CM | POA: Diagnosis not present

## 2018-03-11 DIAGNOSIS — Z86010 Personal history of colonic polyps: Secondary | ICD-10-CM | POA: Insufficient documentation

## 2018-03-11 DIAGNOSIS — Z803 Family history of malignant neoplasm of breast: Secondary | ICD-10-CM | POA: Diagnosis not present

## 2018-03-11 DIAGNOSIS — I10 Essential (primary) hypertension: Secondary | ICD-10-CM | POA: Diagnosis not present

## 2018-03-11 DIAGNOSIS — Z8249 Family history of ischemic heart disease and other diseases of the circulatory system: Secondary | ICD-10-CM | POA: Diagnosis not present

## 2018-03-11 DIAGNOSIS — M1990 Unspecified osteoarthritis, unspecified site: Secondary | ICD-10-CM | POA: Insufficient documentation

## 2018-03-11 DIAGNOSIS — K589 Irritable bowel syndrome without diarrhea: Secondary | ICD-10-CM | POA: Insufficient documentation

## 2018-03-11 DIAGNOSIS — Z8041 Family history of malignant neoplasm of ovary: Secondary | ICD-10-CM | POA: Insufficient documentation

## 2018-03-11 DIAGNOSIS — E119 Type 2 diabetes mellitus without complications: Secondary | ICD-10-CM | POA: Insufficient documentation

## 2018-03-11 DIAGNOSIS — G4730 Sleep apnea, unspecified: Secondary | ICD-10-CM | POA: Insufficient documentation

## 2018-03-11 DIAGNOSIS — D122 Benign neoplasm of ascending colon: Secondary | ICD-10-CM | POA: Diagnosis not present

## 2018-03-11 DIAGNOSIS — Z833 Family history of diabetes mellitus: Secondary | ICD-10-CM | POA: Diagnosis not present

## 2018-03-11 DIAGNOSIS — H409 Unspecified glaucoma: Secondary | ICD-10-CM | POA: Insufficient documentation

## 2018-03-11 DIAGNOSIS — Z79899 Other long term (current) drug therapy: Secondary | ICD-10-CM | POA: Insufficient documentation

## 2018-03-11 DIAGNOSIS — E782 Mixed hyperlipidemia: Secondary | ICD-10-CM | POA: Diagnosis not present

## 2018-03-11 HISTORY — PX: COLONOSCOPY WITH PROPOFOL: SHX5780

## 2018-03-11 HISTORY — DX: Unspecified glaucoma: H40.9

## 2018-03-11 HISTORY — DX: Allergic rhinitis, unspecified: J30.9

## 2018-03-11 HISTORY — DX: Benign neoplasm of stomach: D13.1

## 2018-03-11 LAB — GLUCOSE, CAPILLARY: Glucose-Capillary: 106 mg/dL — ABNORMAL HIGH (ref 70–99)

## 2018-03-11 SURGERY — COLONOSCOPY WITH PROPOFOL
Anesthesia: General

## 2018-03-11 MED ORDER — LIDOCAINE HCL (PF) 2 % IJ SOLN
INTRAMUSCULAR | Status: DC | PRN
  Administered 2018-03-11: 80 mg

## 2018-03-11 MED ORDER — MIDAZOLAM HCL 5 MG/5ML IJ SOLN
INTRAMUSCULAR | Status: DC | PRN
  Administered 2018-03-11: 2 mg via INTRAVENOUS

## 2018-03-11 MED ORDER — PROPOFOL 500 MG/50ML IV EMUL
INTRAVENOUS | Status: AC
  Filled 2018-03-11: qty 50

## 2018-03-11 MED ORDER — SODIUM CHLORIDE 0.9 % IV SOLN: INTRAVENOUS | Status: DC

## 2018-03-11 MED ORDER — SODIUM CHLORIDE 0.9 % IV SOLN
INTRAVENOUS | Status: DC
  Administered 2018-03-11: 1000 mL via INTRAVENOUS

## 2018-03-11 MED ORDER — MIDAZOLAM HCL 2 MG/2ML IJ SOLN
INTRAMUSCULAR | Status: AC
  Filled 2018-03-11: qty 2

## 2018-03-11 MED ORDER — FENTANYL CITRATE (PF) 100 MCG/2ML IJ SOLN
INTRAMUSCULAR | Status: DC | PRN
  Administered 2018-03-11 (×2): 50 ug via INTRAVENOUS

## 2018-03-11 MED ORDER — LIDOCAINE HCL (PF) 2 % IJ SOLN
INTRAMUSCULAR | Status: AC
  Filled 2018-03-11: qty 10

## 2018-03-11 MED ORDER — PROPOFOL 500 MG/50ML IV EMUL
INTRAVENOUS | Status: DC | PRN
  Administered 2018-03-11: 50 ug/kg/min via INTRAVENOUS

## 2018-03-11 MED ORDER — FENTANYL CITRATE (PF) 100 MCG/2ML IJ SOLN
INTRAMUSCULAR | Status: AC
  Filled 2018-03-11: qty 2

## 2018-03-11 MED ORDER — PROPOFOL 10 MG/ML IV BOLUS
INTRAVENOUS | Status: DC | PRN
  Administered 2018-03-11 (×2): 20 mg via INTRAVENOUS

## 2018-03-11 NOTE — Transfer of Care (Signed)
Immediate Anesthesia Transfer of Care Note  Patient: Teresa Moran  Procedure(s) Performed: COLONOSCOPY WITH PROPOFOL (N/A )  Patient Location: PACU  Anesthesia Type:General  Level of Consciousness: sedated  Airway & Oxygen Therapy: Patient Spontanous Breathing and Patient connected to nasal cannula oxygen  Post-op Assessment: Report given to RN and Post -op Vital signs reviewed and stable  Post vital signs: Reviewed and stable  Last Vitals:  Vitals Value Taken Time  BP    Temp    Pulse 70 03/11/2018 11:27 AM  Resp 22 03/11/2018 11:27 AM  SpO2 100 % 03/11/2018 11:27 AM  Vitals shown include unvalidated device data.  Last Pain:  Vitals:   03/11/18 1039  TempSrc: Tympanic  PainSc: 0-No pain         Complications: No apparent anesthesia complications

## 2018-03-11 NOTE — Anesthesia Preprocedure Evaluation (Signed)
Anesthesia Evaluation  Patient identified by MRN, date of birth, ID band Patient awake    Reviewed: Allergy & Precautions, H&P , NPO status , Patient's Chart, lab work & pertinent test results  History of Anesthesia Complications (+) PONV, Family history of anesthesia reaction and history of anesthetic complications  Airway Mallampati: III  TM Distance: >3 FB Neck ROM: full    Dental  (+) Teeth Intact   Pulmonary sleep apnea ,    breath sounds clear to auscultation       Cardiovascular hypertension,  Rhythm:regular Rate:Normal     Neuro/Psych negative neurological ROS  negative psych ROS   GI/Hepatic negative GI ROS, Neg liver ROS,   Endo/Other  diabetes  Renal/GU negative Renal ROS  negative genitourinary   Musculoskeletal  (+) Arthritis ,   Abdominal   Peds  Hematology negative hematology ROS (+)   Anesthesia Other Findings Past Medical History: 2012: Abnormal mammogram     Comment:  resolved No date: Arthritis No date: Complication of anesthesia No date: Diabetes mellitus without complication (HCC) No date: Family history of adverse reaction to anesthesia     Comment:  mom-hard to wake up, n/v No date: Family history of ovarian cancer No date: Fundic gland polyps of stomach, benign No date: Glaucoma 01/27/2016: History of mammogram     Comment:  Birads 1 01/17/2016: History of Papanicolaou smear of cervix     Comment:  NIL/NEG No date: Hypertension No date: IBS (irritable bowel syndrome) No date: Mixed hyperlipidemia No date: Osteoarthritis No date: PONV (postoperative nausea and vomiting)     Comment:  n/v No date: Rhinitis, allergic No date: Sleep apnea     Comment:  does not use CPAP 02/2016: Testing of female for genetic disease carrier status     Comment:  VISTASEQ Neg No date: Torn meniscus     Comment:  RIGHT KNEE-PT STATES IT HAS HEALED BUT STILL HAS               WEAKNESS AND PAIN IN  KNEE No date: Type 2 diabetes mellitus ([place])  Past Surgical History: 07/2009;2014: COLONOSCOPY     Comment:  benign polyps neg No date: DILATION AND CURETTAGE OF UTERUS 11/06/2014: ESOPHAGOGASTRODUODENOSCOPY; N/A     Comment:  Procedure: ESOPHAGOGASTRODUODENOSCOPY (EGD);  Surgeon:               Josefine Class, MD;  Location: [facility] ENDOSCOPY;                Service: Endoscopy;  Laterality: N/A;  With Small Bowel               Biopsies No date: GANGLION CYST EXCISION; Left  BMI    Body Mass Index:  34.76 kg/m      Reproductive/Obstetrics negative OB ROS                             Anesthesia Physical Anesthesia Plan  ASA: III  Anesthesia Plan: General   Post-op Pain Management:    Induction:   PONV Risk Score and Plan: Propofol infusion and TIVA  Airway Management Planned:   Additional Equipment:   Intra-op Plan:   Post-operative Plan:   Informed Consent: I have reviewed the patients History and Physical, chart, labs and discussed the procedure including the risks, benefits and alternatives for the proposed anesthesia with the patient or authorized representative who has indicated his/her understanding and acceptance.   Dental Advisory  Given  Plan Discussed with: Anesthesiologist, CRNA and Surgeon  Anesthesia Plan Comments:         Anesthesia Quick Evaluation

## 2018-03-11 NOTE — Op Note (Signed)
[facility] Gastroenterology Patient Name: Teresa Moran Procedure Date: 03/11/2018 11:00 AM MRN: [id number] Account #: [id number] Date of Birth: [birth year] Admit Type: Outpatient Age: 52 Room: [facility] ENDO ROOM 1 Gender: Female Note Status: Finalized Procedure:            Colonoscopy Indications:          High risk colon cancer surveillance: Personal history                        of colonic polyps Providers:            Manya Silvas, MD Referring MD:         No Local Md, MD (Referring MD) Medicines:            Propofol per Anesthesia Complications:        No immediate complications. Procedure:            Pre-Anesthesia Assessment:                       - After reviewing the risks and benefits, the patient                        was deemed in satisfactory condition to undergo the                        procedure.                       After obtaining informed consent, the colonoscope was                        passed under direct vision. Throughout the procedure,                        the patient's blood pressure, pulse, and oxygen                        saturations were monitored continuously. The                        Colonoscope was introduced through the anus and                        advanced to the the cecum, identified by appendiceal                        orifice and ileocecal valve. The colonoscopy was                        performed without difficulty. The patient tolerated the                        procedure well. The quality of the bowel preparation                        was excellent. Findings:      A diminutive polyp was found in the proximal ascending colon. The polyp       was sessile. The polyp was removed with a jumbo cold forceps. Resection       and retrieval were complete.      Internal hemorrhoids were  found during endoscopy. The hemorrhoids were       small and Grade I (internal hemorrhoids that do not prolapse).      The exam was  otherwise without abnormality. Impression:           - One diminutive polyp in the proximal ascending colon,                        removed with a jumbo cold forceps. Resected and                        retrieved.                       - Internal hemorrhoids.                       - The examination was otherwise normal. Recommendation:       - Await pathology results. Manya Silvas, MD 03/11/2018 11:24:14 AM This report has been signed electronically. Number of Addenda: 0 Note Initiated On: 03/11/2018 11:00 AM Scope Withdrawal Time: 0 hours 9 minutes 16 seconds  Total Procedure Duration: 0 hours 16 minutes 58 seconds       [facility]

## 2018-03-11 NOTE — Anesthesia Post-op Follow-up Note (Signed)
Anesthesia QCDR form completed.        

## 2018-03-11 NOTE — H&P (Signed)
Primary Care Physician:  Chad Cordial, PA-C Primary Gastroenterologist:  Dr. Vira Agar  Pre-Procedure History & Physical: HPI:  Teresa Moran is a 52 y.o. female is here for an colonoscopy.  Done for personal history of colon polyps.   Past Medical History:  Diagnosis Date  . Abnormal mammogram 2012   resolved  . Arthritis   . Complication of anesthesia   . Diabetes mellitus without complication ([place])   . Family history of adverse reaction to anesthesia    mom-hard to wake up, n/v  . Family history of ovarian cancer   . Fundic gland polyps of stomach, benign   . Glaucoma   . History of mammogram 01/27/2016   Birads 1  . History of Papanicolaou smear of cervix 01/17/2016   NIL/NEG  . Hypertension   . IBS (irritable bowel syndrome)   . Mixed hyperlipidemia   . Osteoarthritis   . PONV (postoperative nausea and vomiting)    n/v  . Rhinitis, allergic   . Sleep apnea    does not use CPAP  . Testing of female for genetic disease carrier status 02/2016   VISTASEQ Neg  . Torn meniscus    RIGHT KNEE-PT STATES IT HAS HEALED BUT STILL HAS WEAKNESS AND PAIN IN KNEE  . Type 2 diabetes mellitus ([place])     Past Surgical History:  Procedure Laterality Date  . COLONOSCOPY  07/2009;2014   benign polyps neg  . DILATION AND CURETTAGE OF UTERUS    . ESOPHAGOGASTRODUODENOSCOPY N/A 11/06/2014   Procedure: ESOPHAGOGASTRODUODENOSCOPY (EGD);  Surgeon: Josefine Class, MD;  Location: [facility] ENDOSCOPY;  Service: Endoscopy;  Laterality: N/A;  With Small Bowel Biopsies  . GANGLION CYST EXCISION Left     Prior to Admission medications   Medication Sig Start Date End Date Taking? Authorizing Provider  BIOTIN PO Take 2 each by mouth daily.   Yes [provider]  Cholecalciferol (D 2000) 2000 units TABS Take 2,000 Units by mouth daily.   Yes [provider]  fluticasone (FLONASE) 50 MCG/ACT nasal spray USE 2 SPRAYS IN EACH  NOSTRIL DAILY AS NEEDED FOR ALLERGIES OR RHINITIS  2/45/80  Yes Copland, Alicia B, PA-C  GAVILYTE-N WITH FLAVOR PACK 420 g solution TAKE 4,000 MLS BY MOUTH AS DIRECTED TAKE AS DIRECTED FOR COLON PREP. 12/26/17  Yes [provider]  glucose blood test strip OneTouch Verio strips   Yes [provider]  levocetirizine (XYZAL) 5 MG tablet TAKE 1 TABLET BY MOUTH  EVERY EVENING 9/98/33  Yes Copland, Alicia B, PA-C  lisinopril-hydrochlorothiazide (PRINZIDE,ZESTORETIC) 20-12.5 MG tablet TAKE 1 TABLET BY MOUTH  DAILY 01/04/49  Yes Copland, Alicia B, PA-C  meloxicam (MOBIC) 15 MG tablet TAKE 1 TABLET BY MOUTH ONCE A DAY AS NEEDED FOR PAIN 5/39/76  Yes Copland, Alicia B, PA-C  metFORMIN (GLUCOPHAGE) 500 MG tablet TAKE 1 TABLET BY MOUTH  EVERY EVENING 7/34/19  Yes Copland, Elmo Putt B, PA-C  ONETOUCH DELICA LANCETS 37T MISC OneTouch Delica Lancets 33 gauge   Yes [provider]  pravastatin (PRAVACHOL) 20 MG tablet TAKE 1 TABLET BY MOUTH  DAILY 0/24/09  Yes Copland, Alicia B, PA-C  triamcinolone cream (KENALOG) 0.1 % APPLY ON THE SKIN AS DIRECTED TO AFFECTED ELBOWS 1-2 TIMES A DAY AS NEEDED AVOID FACE/GROIN/AXILLA. 12/04/17  Yes [provider]  polyethylene glycol-electrolytes (NULYTELY/GOLYTELY) 420 g solution Take by mouth. 12/26/17   [provider]    Allergies as of 01/14/2018  . (No Known Allergies)  Family History  Problem Relation Age of Onset  . Hypertension Mother   . Diabetes Father   . Heart disease Father   . Hypertension Father   . Breast cancer Cousin 19  . Ovarian cancer Maternal Aunt 70  . Ovarian cancer Cousin 36    Social History   Socioeconomic History  . Marital status: Married    Spouse name: Not on file  . Number of children: Not on file  . Years of education: Not on file  . Highest education level: Not on file  Occupational History  . Not on file  Social Needs  . Financial resource strain: Not on file  . Food insecurity:    Worry: Not on file    Inability: Not on file  .  Transportation needs:    Medical: Not on file    Non-medical: Not on file  Tobacco Use  . Smoking status: Never Smoker  . Smokeless tobacco: Never Used  Substance and Sexual Activity  . Alcohol use: Yes  . Drug use: No  . Sexual activity: Yes    Birth control/protection: Post-menopausal  Lifestyle  . Physical activity:    Days per week: Not on file    Minutes per session: Not on file  . Stress: Not on file  Relationships  . Social connections:    Talks on phone: Not on file    Gets together: Not on file    Attends religious service: Not on file    Active member of club or organization: Not on file    Attends meetings of clubs or organizations: Not on file    Relationship status: Not on file  . Intimate partner violence:    Fear of current or ex partner: Not on file    Emotionally abused: Not on file    Physically abused: Not on file    Forced sexual activity: Not on file  Other Topics Concern  . Not on file  Social History Narrative  . Not on file    Review of Systems: See HPI, otherwise negative ROS  Physical Exam: BP (!) 153/81   Pulse 66   Temp (!) 97.1 F (36.2 C) (Tympanic)   Resp 18   Ht 5' (1.524 m)   Wt 80.7 kg   LMP 11/06/2014   SpO2 100%   BMI 34.76 kg/m  General:   Alert,  pleasant and cooperative in NAD Head:  Normocephalic and atraumatic. Neck:  Supple; no masses or thyromegaly. Lungs:  Clear throughout to auscultation.    Heart:  Regular rate and rhythm. Abdomen:  Soft, nontender and nondistended. Normal bowel sounds, without guarding, and without rebound.   Neurologic:  Alert and  oriented x4;  grossly normal neurologically.  Impression/Plan: Teresa Moran is here for an colonoscopy to be performed for [facility] colon polyps.  Risks, benefits, limitations, and alternatives regarding  colonoscopy have been reviewed with the patient.  Questions have been answered.  All parties agreeable.   Gaylyn Cheers, MD  03/11/2018, 10:56 AM

## 2018-03-13 LAB — SURGICAL PATHOLOGY

## 2018-03-13 NOTE — Anesthesia Postprocedure Evaluation (Signed)
Anesthesia Post Note  Patient: Teresa Moran  Procedure(s) Performed: COLONOSCOPY WITH PROPOFOL (N/A )  Patient location during evaluation: PACU Anesthesia Type: General Level of consciousness: awake and alert Pain management: pain level controlled Vital Signs Assessment: post-procedure vital signs reviewed and stable Respiratory status: spontaneous breathing, nonlabored ventilation and respiratory function stable Cardiovascular status: blood pressure returned to baseline and stable Postop Assessment: no apparent nausea or vomiting Anesthetic complications: no     Last Vitals:  Vitals:   03/11/18 1127 03/11/18 1158  BP: 123/69 138/72  Pulse: 68   Resp:  16  Temp: (!) 36.1 C   SpO2:  98%    Last Pain:  Vitals:   03/11/18 1158  TempSrc:   PainSc: 0-No pain                 Durenda Hurt

## 2018-04-01 ENCOUNTER — Encounter: Payer: Self-pay | Admitting: Obstetrics and Gynecology

## 2018-04-01 DIAGNOSIS — E119 Type 2 diabetes mellitus without complications: Secondary | ICD-10-CM

## 2018-04-01 DIAGNOSIS — I10 Essential (primary) hypertension: Secondary | ICD-10-CM

## 2018-04-04 ENCOUNTER — Other Ambulatory Visit: Payer: 59

## 2018-04-04 DIAGNOSIS — E119 Type 2 diabetes mellitus without complications: Secondary | ICD-10-CM

## 2018-04-04 DIAGNOSIS — Z1321 Encounter for screening for nutritional disorder: Secondary | ICD-10-CM

## 2018-04-04 DIAGNOSIS — I10 Essential (primary) hypertension: Secondary | ICD-10-CM

## 2018-04-04 DIAGNOSIS — Z0000 Encounter for general adult medical examination without abnormal findings: Secondary | ICD-10-CM

## 2018-04-04 DIAGNOSIS — E782 Mixed hyperlipidemia: Secondary | ICD-10-CM

## 2018-04-05 LAB — COMPREHENSIVE METABOLIC PANEL WITH GFR
ALT: 14 IU/L (ref 0–32)
AST: 13 IU/L (ref 0–40)
Albumin/Globulin Ratio: 2.8 — ABNORMAL HIGH (ref 1.2–2.2)
Albumin: 4.7 g/dL (ref 3.5–5.5)
Alkaline Phosphatase: 66 IU/L (ref 39–117)
BUN/Creatinine Ratio: 17 (ref 9–23)
BUN: 14 mg/dL (ref 6–24)
Bilirubin Total: 0.4 mg/dL (ref 0.0–1.2)
CO2: 23 mmol/L (ref 20–29)
Calcium: 9.9 mg/dL (ref 8.7–10.2)
Chloride: 101 mmol/L (ref 96–106)
Creatinine, Ser: 0.84 mg/dL (ref 0.57–1.00)
GFR calc Af Amer: 92 mL/min/1.73
GFR calc non Af Amer: 80 mL/min/1.73
Globulin, Total: 1.7 g/dL (ref 1.5–4.5)
Glucose: 114 mg/dL — ABNORMAL HIGH (ref 65–99)
Potassium: 4.2 mmol/L (ref 3.5–5.2)
Sodium: 141 mmol/L (ref 134–144)
Total Protein: 6.4 g/dL (ref 6.0–8.5)

## 2018-04-05 LAB — LIPID PANEL
Chol/HDL Ratio: 3.9 ratio (ref 0.0–4.4)
Cholesterol, Total: 194 mg/dL (ref 100–199)
HDL: 50 mg/dL
LDL Calculated: 124 mg/dL — ABNORMAL HIGH (ref 0–99)
Triglycerides: 100 mg/dL (ref 0–149)
VLDL Cholesterol Cal: 20 mg/dL (ref 5–40)

## 2018-04-05 LAB — MICROALBUMIN / CREATININE URINE RATIO
CREATININE, UR: 113.9 mg/dL
Microalb/Creat Ratio: <2.6 mg/g creat (ref 0.0–30.0)

## 2018-04-05 LAB — VITAMIN D 25 HYDROXY (VIT D DEFICIENCY, FRACTURES): Vit D, 25-Hydroxy: 41.8 ng/mL (ref 30.0–100.0)

## 2018-04-05 LAB — HEMOGLOBIN A1C
Est. average glucose Bld gHb Est-mCnc: 123 mg/dL
Hgb A1c MFr Bld: 5.9 % — ABNORMAL HIGH (ref 4.8–5.6)

## 2018-04-08 MED ORDER — LISINOPRIL-HYDROCHLOROTHIAZIDE 20-12.5 MG PO TABS: 1.0000 | ORAL_TABLET | Freq: Every day | ORAL | 2 refills | Status: DC

## 2018-04-08 MED ORDER — METFORMIN HCL 500 MG PO TABS: 500.0000 mg | ORAL_TABLET | Freq: Every evening | ORAL | 2 refills | Status: DC

## 2018-04-08 MED ORDER — PRAVASTATIN SODIUM 20 MG PO TABS: 20.0000 mg | ORAL_TABLET | Freq: Every day | ORAL | 2 refills | Status: DC

## 2018-04-08 NOTE — Telephone Encounter (Signed)
Pt aware of recent labs. Doing well with metformin, lisinopril-HCTZ, and pravastatin. Has lost wt with wt watchers. Rx RF till 8/20 annual. Rechk labs then.  Meds ordered this encounter  Medications  . metFORMIN (GLUCOPHAGE) 500 MG tablet    Sig: Take 1 tablet (500 mg total) by mouth every evening.    Dispense:  90 tablet    Refill:  2    Order Specific Question:   Supervising Provider    Answer:   Gae Dry [id number]  . pravastatin (PRAVACHOL) 20 MG tablet    Sig: Take 1 tablet (20 mg total) by mouth daily.    Dispense:  90 tablet    Refill:  2    Order Specific Question:   Supervising Provider    Answer:   Gae Dry [id number]  . lisinopril-hydrochlorothiazide (PRINZIDE,ZESTORETIC) 20-12.5 MG tablet    Sig: Take 1 tablet by mouth daily.    Dispense:  90 tablet    Refill:  2    Order Specific Question:   Supervising Provider    Answer:   Gae Dry [[id number]]

## 2018-04-16 ENCOUNTER — Other Ambulatory Visit: Payer: Self-pay | Admitting: Obstetrics and Gynecology

## 2018-04-16 NOTE — Telephone Encounter (Signed)
Please advise 

## 2018-05-15 IMAGING — US US ABDOMEN COMPLETE
1 series · 14 of 25 positions shown · non-contrast
Comparison: None.

CLINICAL DATA: Upper abdominal pain

EXAM:
ABDOMEN ULTRASOUND COMPLETE

[Series 1: us abdomen complete · 0.20mm/px · 14 of 116 slices shown]
[im 1/116]
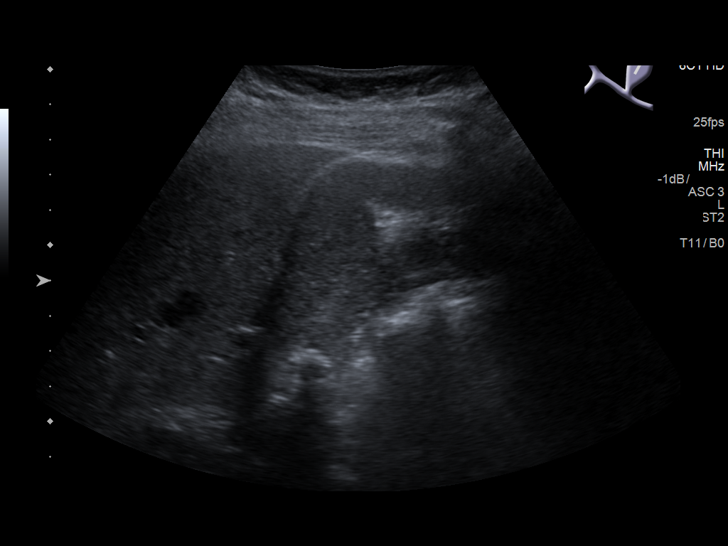
[im 10/116]
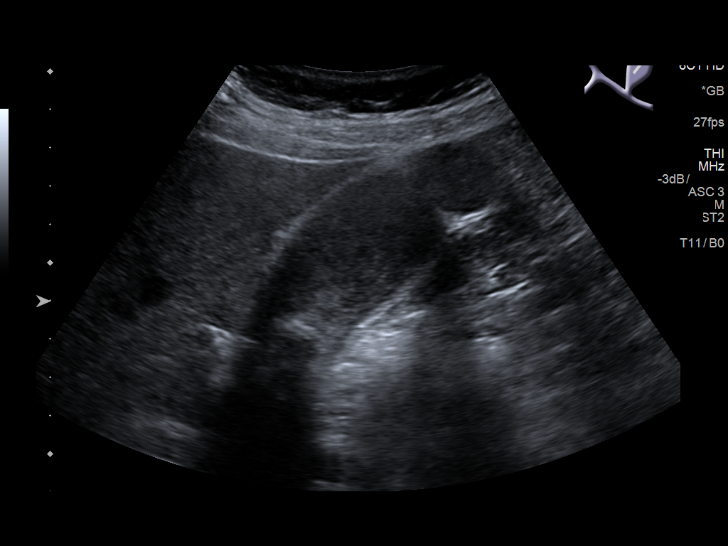
[im 20/116]
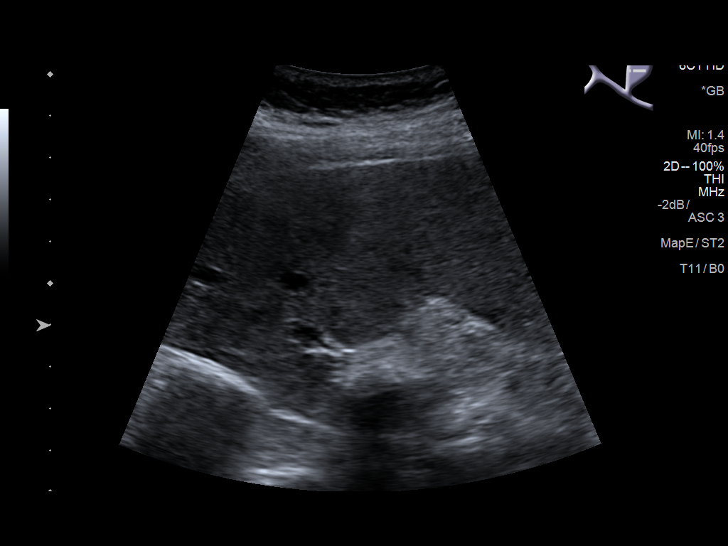
[im 29/116]
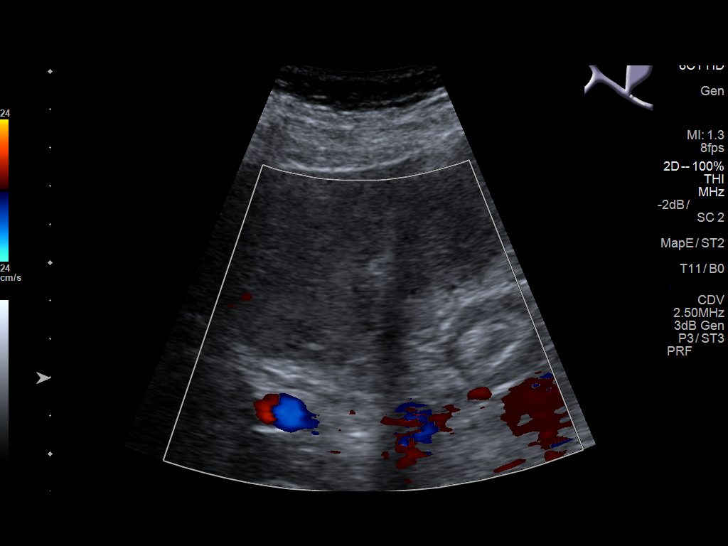
[im 39/116]
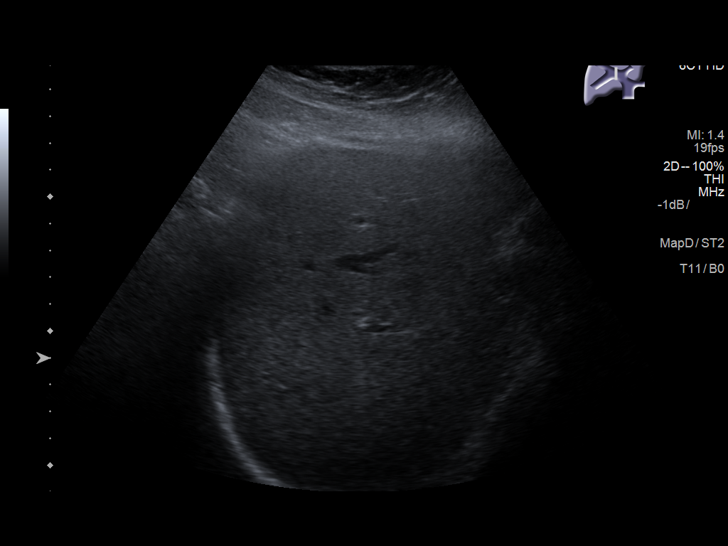
[im 44/116]
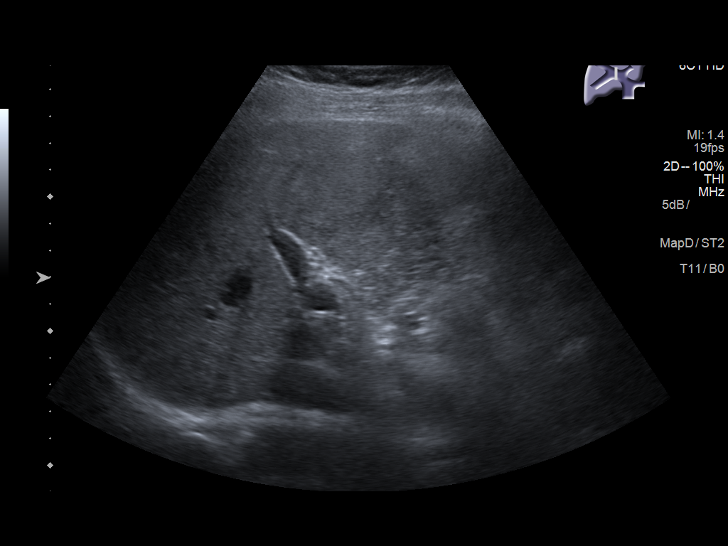
[im 53/116]
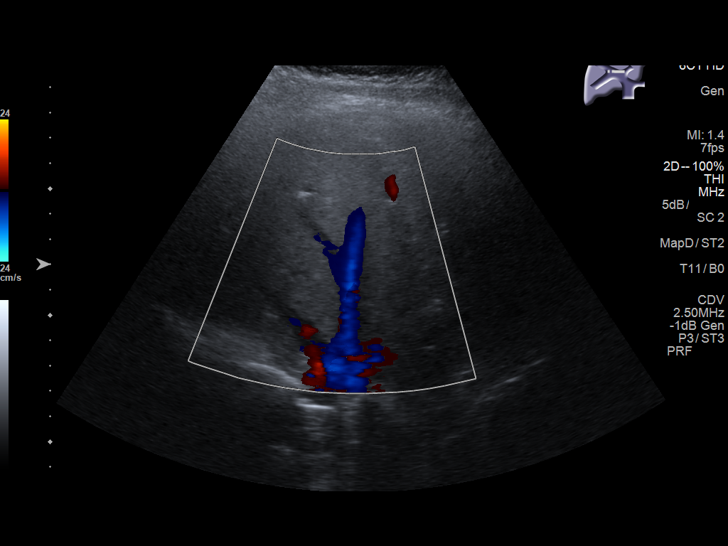
[im 63/116]
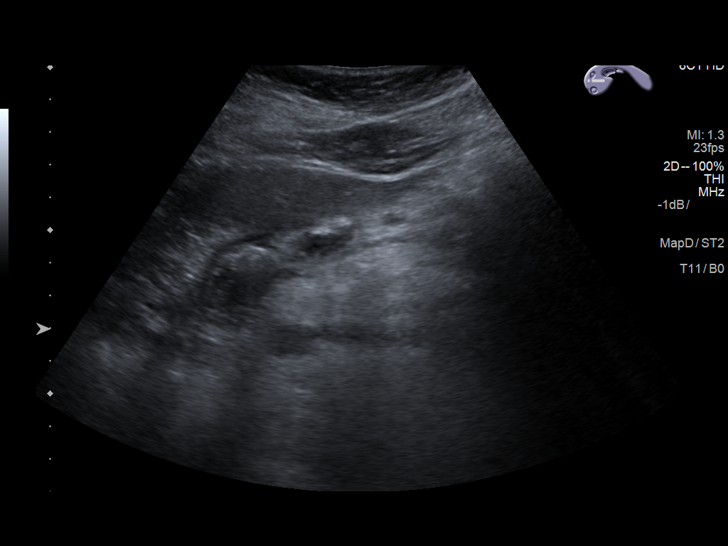
[im 72/116]
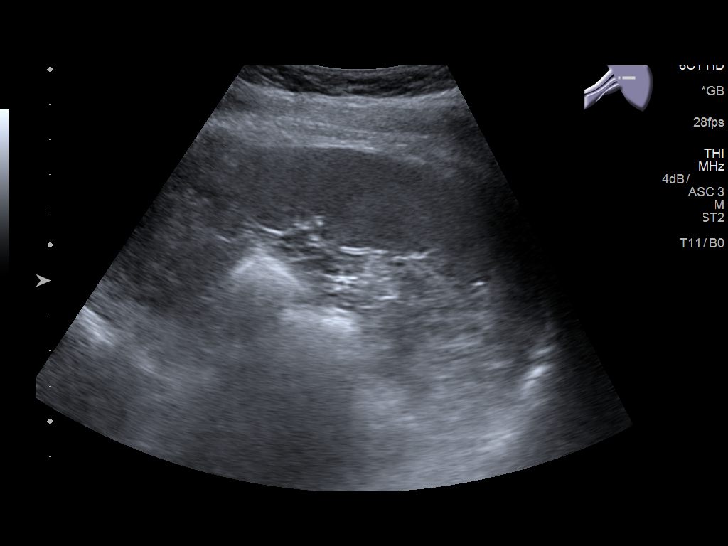
[im 77/116]
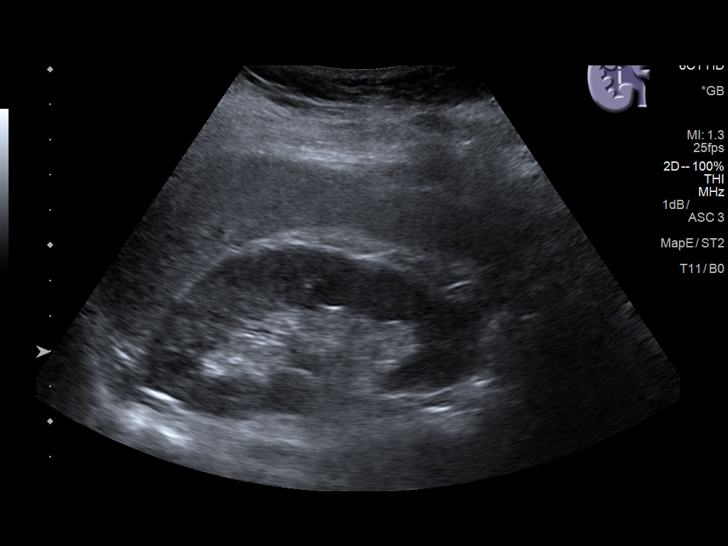
[im 87/116]
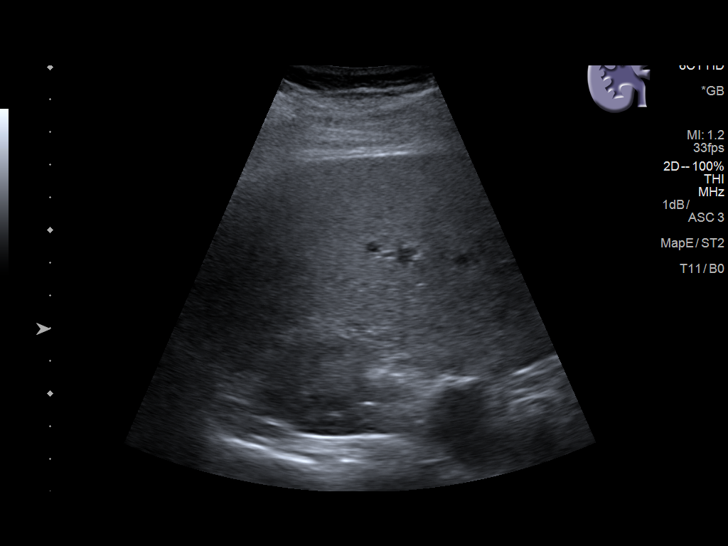
[im 96/116]
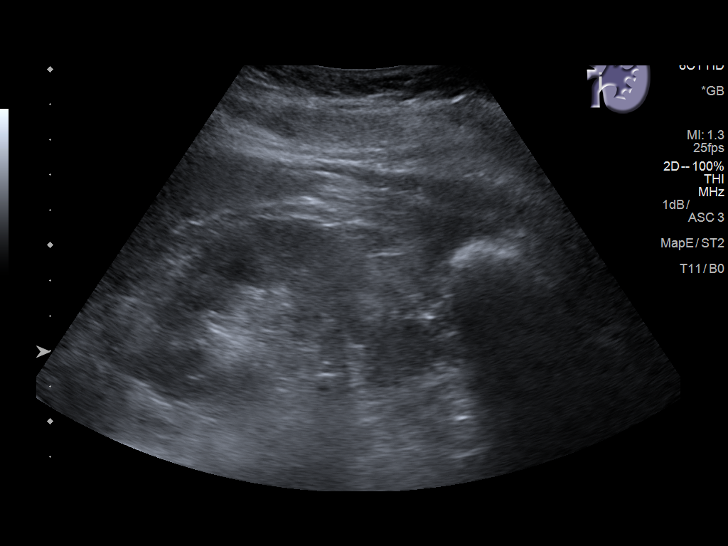
[im 106/116]
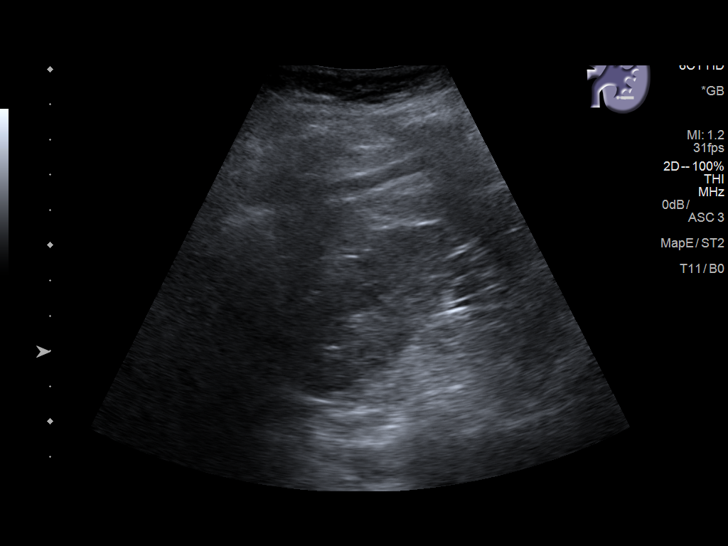
[im 116/116]
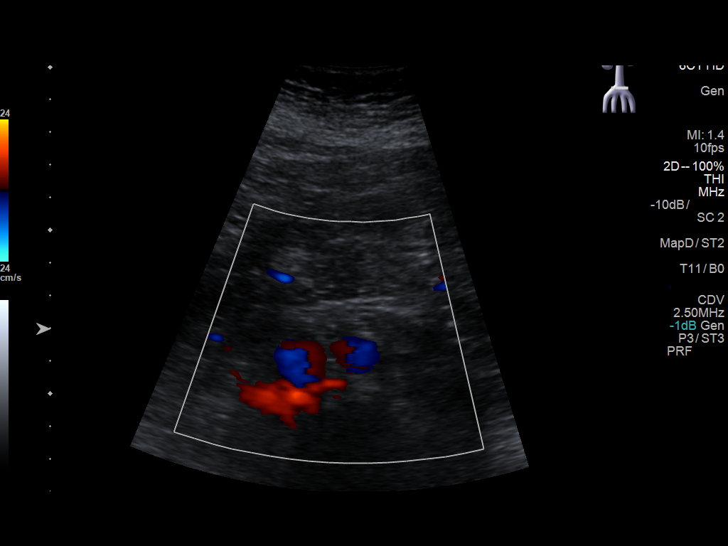

[14 of 25 positions shown; findings below may reference images not displayed]

FINDINGS: Gallbladder: Gallbladder is well distended with multiple gallstones
within. Considerable sludge is noted throughout the gallbladder. No
wall thickening or pericholecystic fluid is noted. Negative
sonographic Murphy sign is noted.

Common bile duct: Diameter: 2.4 mm.

Liver: No focal lesion identified. Within normal limits in
parenchymal echogenicity. Portal vein is patent on color Doppler
imaging with normal direction of blood flow towards the liver.

IVC: No abnormality visualized.

Pancreas: Visualized portion unremarkable.

Spleen: Size and appearance within normal limits.

Right Kidney: Length: 9.8 cm.. Echogenicity within normal limits. No
mass or hydronephrosis visualized.

Left Kidney: Length: 11.4 cm.. Echogenicity within normal limits. No
mass or hydronephrosis visualized.

Abdominal aorta: No aneurysm visualized.

Other findings: None.
IMPRESSION: Well distended gallbladder with gallstones within. A considerable
amount of gallbladder sludge is noted as well.

## 2018-08-13 ENCOUNTER — Encounter: Payer: Self-pay | Admitting: Obstetrics and Gynecology

## 2018-08-14 ENCOUNTER — Other Ambulatory Visit: Payer: Self-pay | Admitting: Obstetrics and Gynecology

## 2018-08-14 MED ORDER — LEVOCETIRIZINE DIHYDROCHLORIDE 5 MG PO TABS: 5.0000 mg | ORAL_TABLET | Freq: Every evening | ORAL | 1 refills | Status: DC

## 2018-08-14 NOTE — Progress Notes (Signed)
Rx RF x[facility]al to [facility]. Annual due 8/20

## 2018-11-26 ENCOUNTER — Telehealth: Payer: Self-pay | Admitting: Obstetrics and Gynecology

## 2018-11-26 DIAGNOSIS — I10 Essential (primary) hypertension: Secondary | ICD-10-CM

## 2018-11-26 DIAGNOSIS — E782 Mixed hyperlipidemia: Secondary | ICD-10-CM

## 2018-11-26 DIAGNOSIS — E119 Type 2 diabetes mellitus without complications: Secondary | ICD-10-CM

## 2018-11-26 NOTE — Telephone Encounter (Signed)
Lab orders placed. Needs to be fasting. Pls call pt to sched appt. Thx.

## 2018-11-26 NOTE — Telephone Encounter (Signed)
Patient is schedule 01/29/19 for her annual. Patient is requesting to schedule prior to that appointment to come in to have her labs drawn. Could you please place that order so I can schedule her. Thank you?

## 2018-11-27 NOTE — Telephone Encounter (Signed)
Called and left voice mail for patient to call back to be schedule °

## 2019-01-19 ENCOUNTER — Other Ambulatory Visit: Payer: Self-pay | Admitting: Obstetrics and Gynecology

## 2019-01-19 DIAGNOSIS — E119 Type 2 diabetes mellitus without complications: Secondary | ICD-10-CM

## 2019-01-21 ENCOUNTER — Other Ambulatory Visit: Payer: 59

## 2019-01-21 ENCOUNTER — Other Ambulatory Visit: Payer: Self-pay

## 2019-01-21 DIAGNOSIS — I10 Essential (primary) hypertension: Secondary | ICD-10-CM

## 2019-01-21 DIAGNOSIS — E782 Mixed hyperlipidemia: Secondary | ICD-10-CM

## 2019-01-21 DIAGNOSIS — E119 Type 2 diabetes mellitus without complications: Secondary | ICD-10-CM

## 2019-01-22 ENCOUNTER — Encounter: Payer: Self-pay | Admitting: Obstetrics and Gynecology

## 2019-01-22 LAB — COMPREHENSIVE METABOLIC PANEL
ALT: 15 IU/L (ref 0–32)
AST: 11 IU/L (ref 0–40)
Albumin/Globulin Ratio: 2.5 — ABNORMAL HIGH (ref 1.2–2.2)
Albumin: 4.5 g/dL (ref 3.8–4.9)
Alkaline Phosphatase: 61 IU/L (ref 39–117)
BUN/Creatinine Ratio: 14 (ref 9–23)
BUN: 12 mg/dL (ref 6–24)
Bilirubin Total: 0.4 mg/dL (ref 0.0–1.2)
CO2: 26 mmol/L (ref 20–29)
Calcium: 9.5 mg/dL (ref 8.7–10.2)
Chloride: 101 mmol/L (ref 96–106)
Creatinine, Ser: 0.88 mg/dL (ref 0.57–1.00)
GFR calc Af Amer: 86 mL/min/{1.73_m2} (ref >59)
GFR calc non Af Amer: 75 mL/min/{1.73_m2} (ref >59)
Globulin, Total: 1.8 g/dL (ref 1.5–4.5)
Glucose: 111 mg/dL — ABNORMAL HIGH (ref 65–99)
Potassium: 3.9 mmol/L (ref 3.5–5.2)
Sodium: 141 mmol/L (ref 134–144)
Total Protein: 6.3 g/dL (ref 6.0–8.5)

## 2019-01-22 LAB — MICROALBUMIN / CREATININE URINE RATIO
Creatinine, Urine: 124.1 mg/dL
Microalb/Creat Ratio: <2 mg/g creat (ref 0–29)
Microalbumin, Urine: <3 ug/mL

## 2019-01-22 LAB — LIPID PANEL
Chol/HDL Ratio: 4.2 ratio (ref 0.0–4.4)
Cholesterol, Total: 221 mg/dL — ABNORMAL HIGH (ref 100–199)
HDL: 53 mg/dL
LDL Calculated: 145 mg/dL — ABNORMAL HIGH (ref 0–99)
Triglycerides: 114 mg/dL (ref 0–149)
VLDL Cholesterol Cal: 23 mg/dL (ref 5–40)

## 2019-01-22 LAB — HEMOGLOBIN A1C
Est. average glucose Bld gHb Est-mCnc: 126 mg/dL
Hgb A1c MFr Bld: 6 % — ABNORMAL HIGH (ref 4.8–5.6)

## 2019-01-27 ENCOUNTER — Other Ambulatory Visit: Payer: Self-pay | Admitting: Obstetrics and Gynecology

## 2019-01-27 DIAGNOSIS — I10 Essential (primary) hypertension: Secondary | ICD-10-CM

## 2019-01-27 DIAGNOSIS — E119 Type 2 diabetes mellitus without complications: Secondary | ICD-10-CM

## 2019-01-27 DIAGNOSIS — J309 Allergic rhinitis, unspecified: Secondary | ICD-10-CM

## 2019-01-27 NOTE — Progress Notes (Signed)
Will send in Rx RF at annual. Metformin refilled last wk to [facility].

## 2019-01-28 NOTE — Telephone Encounter (Signed)
Please advise 

## 2019-01-29 ENCOUNTER — Ambulatory Visit (INDEPENDENT_AMBULATORY_CARE_PROVIDER_SITE_OTHER): Payer: 59 | Admitting: Obstetrics and Gynecology

## 2019-01-29 ENCOUNTER — Encounter: Payer: Self-pay | Admitting: Obstetrics and Gynecology

## 2019-01-29 ENCOUNTER — Other Ambulatory Visit: Payer: Self-pay

## 2019-01-29 VITALS — BP 110/70 | Ht 60.0 in | Wt 174.0 lb

## 2019-01-29 DIAGNOSIS — I10 Essential (primary) hypertension: Secondary | ICD-10-CM

## 2019-01-29 DIAGNOSIS — E119 Type 2 diabetes mellitus without complications: Secondary | ICD-10-CM

## 2019-01-29 DIAGNOSIS — Z1151 Encounter for screening for human papillomavirus (HPV): Secondary | ICD-10-CM

## 2019-01-29 DIAGNOSIS — N951 Menopausal and female climacteric states: Secondary | ICD-10-CM

## 2019-01-29 DIAGNOSIS — J309 Allergic rhinitis, unspecified: Secondary | ICD-10-CM

## 2019-01-29 DIAGNOSIS — Z01419 Encounter for gynecological examination (general) (routine) without abnormal findings: Secondary | ICD-10-CM

## 2019-01-29 DIAGNOSIS — Z1239 Encounter for other screening for malignant neoplasm of breast: Secondary | ICD-10-CM

## 2019-01-29 DIAGNOSIS — Z1321 Encounter for screening for nutritional disorder: Secondary | ICD-10-CM

## 2019-01-29 DIAGNOSIS — E782 Mixed hyperlipidemia: Secondary | ICD-10-CM

## 2019-01-29 DIAGNOSIS — Z124 Encounter for screening for malignant neoplasm of cervix: Secondary | ICD-10-CM

## 2019-01-29 MED ORDER — LISINOPRIL-HYDROCHLOROTHIAZIDE 20-12.5 MG PO TABS: 1.0000 | ORAL_TABLET | Freq: Every day | ORAL | 3 refills | Status: DC

## 2019-01-29 MED ORDER — METFORMIN HCL 500 MG PO TABS: 500.0000 mg | ORAL_TABLET | Freq: Every evening | ORAL | 0 refills | Status: DC

## 2019-01-29 MED ORDER — PRAVASTATIN SODIUM 20 MG PO TABS: 20.0000 mg | ORAL_TABLET | Freq: Every day | ORAL | 0 refills | Status: DC

## 2019-01-29 MED ORDER — LEVOCETIRIZINE DIHYDROCHLORIDE 5 MG PO TABS: 5.0000 mg | ORAL_TABLET | Freq: Every evening | ORAL | 3 refills | Status: DC

## 2019-01-29 NOTE — Progress Notes (Signed)
Chief Complaint  Patient presents with  . Gynecologic Exam  . [employer] Employee    HPI:      [name] is a 53 y.o. No obstetric history on file. who LMP was Patient's last menstrual period was 11/06/2014., presents today for her annual examination.  Her menses are absent. She does not have intermenstrual bleeding. She does have occas tolerable vasomotor sx.   Sex activity: single partner, contraception - post menopausal status. She does have vaginal dryness occas and sometimes notes a tinge of blood with wiping afterwards. Has not tried lubricants.  Last Pap: January 17, 2016  Results were: no abnormalities /neg HPV DNA.  Hx of STDs: none  Last mammogram: 02/14/18 Results were: normal--routine follow-up in 12 months There is a FH of breast cancer in her pat cousin. There is a FH of ovarian cancer in her cousin and possibly mat aunt. Vistaseq testing was neg 2018. The patient does do self-breast exams.  Colonoscopy: colonoscopy 10/19 with polyps, hx of polyps on prior colonoscopy too. Repeat due after ? 3-5 yrs. Done by Dr. Vira Agar.  Tobacco use: The patient denies current or previous tobacco use. Alcohol use: none  Drug use: none Exercise: moderately active  She does get adequate calcium and Vitamin D in her diet.  She has HTN and is doing well on lisinopril/HCTZ. She has type 2 DM, slightly increasd HgA1C 01/21/19 from last yr. Taking metformin 500 mg QD. Not doing as well with wt watchers since Covid but plans to start again. She gets eye exam yearly and due later this yr. Lipids borderline on pravastatin 20 mg daily 8/20, normal last yr. No side effects with meds. Repeat labs due 11/20 since not as well controlled on meds. Rx RF due till then.   Recent Results (from the past 2160 hour(s))  Lipid panel     Status: Abnormal   Collection Time: 01/21/19  8:30 AM  Result Value Ref Range   Cholesterol, Total 221 (H) 100 - 199 mg/dL   Triglycerides 114 0 - 149 mg/dL   HDL 53  >39 mg/dL   VLDL Cholesterol Cal 23 5 - 40 mg/dL   LDL Calculated 145 (H) 0 - 99 mg/dL   Chol/HDL Ratio 4.2 0.0 - 4.4 ratio    Comment:                                   T. Chol/HDL Ratio                                             Men  Women                               1/2 Avg.Risk  3.4    3.3                                   Avg.Risk  5.0    4.4                                2X Avg.Risk  9.6    7.1  3X Avg.Risk 23.4   11.0   Hemoglobin A1c     Status: Abnormal   Collection Time: 01/21/19  8:30 AM  Result Value Ref Range   Hgb A1c MFr Bld 6.0 (H) 4.8 - 5.6 %    Comment:          Prediabetes: 5.7 - 6.4          Diabetes: >6.4          Glycemic control for adults with diabetes: <7.0    Est. average glucose Bld gHb Est-mCnc 126 mg/dL  Comprehensive metabolic panel     Status: Abnormal   Collection Time: 01/21/19  8:30 AM  Result Value Ref Range   Glucose 111 (H) 65 - 99 mg/dL   BUN 12 6 - 24 mg/dL   Creatinine, Ser 0.88 0.57 - 1.00 mg/dL   GFR calc non Af Amer 75 >59 mL/min/1.73   GFR calc Af Amer 86 >59 mL/min/1.73   BUN/Creatinine Ratio 14 9 - 23   Sodium 141 134 - 144 mmol/L   Potassium 3.9 3.5 - 5.2 mmol/L   Chloride 101 96 - 106 mmol/L   CO2 26 20 - 29 mmol/L   Calcium 9.5 8.7 - 10.2 mg/dL   Total Protein 6.3 6.0 - 8.5 g/dL   Albumin 4.5 3.8 - 4.9 g/dL   Globulin, Total 1.8 1.5 - 4.5 g/dL   Albumin/Globulin Ratio 2.5 (H) 1.2 - 2.2   Bilirubin Total 0.4 0.0 - 1.2 mg/dL   Alkaline Phosphatase 61 39 - 117 IU/L   AST 11 0 - 40 IU/L   ALT 15 0 - 32 IU/L  Microalbumin / creatinine urine ratio     Status: None   Collection Time: 01/21/19  4:39 PM  Result Value Ref Range   Creatinine, Urine 124.1 Not Estab. mg/dL   Microalbumin, Urine <3.0 Not Estab. ug/mL   Microalb/Creat Ratio <2 0 - 29 mg/g creat    Comment:                        Normal:                0 -  29                        Moderately increased: 30 - 300                         Severely increased:       >300               **Please note reference interval change**      Past Medical History:  Diagnosis Date  . Abnormal mammogram 2012   resolved  . Arthritis   . Complication of anesthesia   . Diabetes mellitus without complication ([place])   . Family history of adverse reaction to anesthesia    mom-hard to wake up, n/v  . Family history of ovarian cancer   . Fundic gland polyps of stomach, benign   . Glaucoma   . History of mammogram 01/27/2016   Birads 1  . History of Papanicolaou smear of cervix 01/17/2016   NIL/NEG  . Hypertension   . IBS (irritable bowel syndrome)   . Mixed hyperlipidemia   . Osteoarthritis   . PONV (postoperative nausea and vomiting)    n/v  . Rhinitis, allergic   . Sleep apnea    does  not use CPAP  . Testing of female for genetic disease carrier status 02/2016   VISTASEQ Neg  . Torn meniscus    RIGHT KNEE-PT STATES IT HAS HEALED BUT STILL HAS WEAKNESS AND PAIN IN KNEE  . Type 2 diabetes mellitus ([place])     Past Surgical History:  Procedure Laterality Date  . COLONOSCOPY  07/2009;2014   benign polyps neg  . COLONOSCOPY WITH PROPOFOL N/A 03/11/2018   Procedure: COLONOSCOPY WITH PROPOFOL;  Surgeon: Manya Silvas, MD;  Location: [facility] ENDOSCOPY;  Service: Endoscopy;  Laterality: N/A;  . DILATION AND CURETTAGE OF UTERUS    . ESOPHAGOGASTRODUODENOSCOPY N/A 11/06/2014   Procedure: ESOPHAGOGASTRODUODENOSCOPY (EGD);  Surgeon: Josefine Class, MD;  Location: [facility] ENDOSCOPY;  Service: Endoscopy;  Laterality: N/A;  With Small Bowel Biopsies  . GANGLION CYST EXCISION Left     Family History  Problem Relation Age of Onset  . Hypertension Mother   . Diabetes Father   . Heart disease Father   . Hypertension Father   . Breast cancer Cousin 63  . Ovarian cancer Maternal Aunt 70  . Ovarian cancer Cousin 57    Social History   Socioeconomic History  . Marital status: Married    Spouse name: Not on file  . Number of children:  Not on file  . Years of education: Not on file  . Highest education level: Not on file  Occupational History  . Not on file  Social Needs  . Financial resource strain: Not on file  . Food insecurity    Worry: Not on file    Inability: Not on file  . Transportation needs    Medical: Not on file    Non-medical: Not on file  Tobacco Use  . Smoking status: Never Smoker  . Smokeless tobacco: Never Used  Substance and Sexual Activity  . Alcohol use: Yes  . Drug use: No  . Sexual activity: Yes    Birth control/protection: Post-menopausal  Lifestyle  . Physical activity    Days per week: Not on file    Minutes per session: Not on file  . Stress: Not on file  Relationships  . Social Herbalist on phone: Not on file    Gets together: Not on file    Attends religious service: Not on file    Active member of club or organization: Not on file    Attends meetings of clubs or organizations: Not on file    Relationship status: Not on file  . Intimate partner violence    Fear of current or ex partner: Not on file    Emotionally abused: Not on file    Physically abused: Not on file    Forced sexual activity: Not on file  Other Topics Concern  . Not on file  Social History Narrative  . Not on file     Current Outpatient Medications:  .  BIOTIN PO, Take 2 each by mouth daily., Disp: , Rfl:  .  Cholecalciferol (D 2000) 2000 units TABS, Take 2,000 Units by mouth daily., Disp: , Rfl:  .  fluticasone (FLONASE) 50 MCG/ACT nasal spray, USE 2 SPRAYS IN EACH  NOSTRIL DAILY AS NEEDED FOR ALLERGIES OR RHINITIS, Disp: 48 g, Rfl: 0 .  glucose blood test strip, OneTouch Verio strips, Disp: , Rfl:  .  levocetirizine (XYZAL) 5 MG tablet, Take 1 tablet (5 mg total) by mouth every evening., Disp: 90 tablet, Rfl: 3 .  lisinopril-hydrochlorothiazide (ZESTORETIC) 20-12.5 MG tablet,  Take 1 tablet by mouth daily., Disp: 90 tablet, Rfl: 3 .  meloxicam (MOBIC) 15 MG tablet, TAKE 1 TABLET BY MOUTH  ONCE A DAY AS NEEDED FOR PAIN, Disp: 90 tablet, Rfl: 0 .  metFORMIN (GLUCOPHAGE) 500 MG tablet, Take 1 tablet (500 mg total) by mouth every evening., Disp: 90 tablet, Rfl: 0 .  ONETOUCH DELICA LANCETS [id number] MISC, OneTouch Delica Lancets 33 gauge, Disp: , Rfl:  .  pravastatin (PRAVACHOL) 20 MG tablet, Take 1 tablet (20 mg total) by mouth daily., Disp: 90 tablet, Rfl: 0 .  triamcinolone cream (KENALOG) 0.1 %, APPLY ON THE SKIN AS DIRECTED TO AFFECTED ELBOWS 1-2 TIMES A DAY AS NEEDED AVOID FACE/GROIN/AXILLA., Disp: , Rfl: 2   ROS:  Review of Systems  Constitutional: Negative for fatigue, fever and unexpected weight change.  Respiratory: Negative for cough, shortness of breath and wheezing.   Cardiovascular: Negative for chest pain, palpitations and leg swelling.  Gastrointestinal: Negative for abdominal pain, blood in stool, constipation, diarrhea, nausea and vomiting.  Endocrine: Negative for cold intolerance, heat intolerance and polyuria.  Genitourinary: Positive for dyspareunia. Negative for dysuria, flank pain, frequency, genital sores, hematuria, menstrual problem, pelvic pain, urgency, vaginal bleeding, vaginal discharge and vaginal pain.  Musculoskeletal: Positive for arthralgias. Negative for back pain, joint swelling and myalgias.  Skin: Negative for rash.  Neurological: Negative for dizziness, syncope, light-headedness, numbness and headaches.  Hematological: Negative for adenopathy.  Psychiatric/Behavioral: Negative for agitation, confusion, sleep disturbance and suicidal ideas. The patient is not nervous/anxious.     Objective: BP 110/70   Ht 5' (1.524 m)   Wt 174 lb (78.9 kg)   LMP 11/06/2014   BMI 33.98 kg/m    Physical Exam Constitutional:      Appearance: She is well-developed.  Genitourinary:     Vulva, vagina, uterus, right adnexa and left adnexa normal.     No vulval lesion or tenderness noted.     No vaginal discharge, erythema or tenderness.     No cervical  motion tenderness or polyp.     Uterus is not enlarged or tender.     No right or left adnexal mass present.     Right adnexa not tender.     Left adnexa not tender.  Neck:     Musculoskeletal: Normal range of motion.     Thyroid: No thyromegaly.  Cardiovascular:     Rate and Rhythm: Normal rate and regular rhythm.     Heart sounds: Normal heart sounds. No murmur.  Pulmonary:     Effort: Pulmonary effort is normal.     Breath sounds: Normal breath sounds.  Chest:     Breasts:        Right: No mass, nipple discharge, skin change or tenderness.        Left: No mass, nipple discharge, skin change or tenderness.  Abdominal:     Palpations: Abdomen is soft.     Tenderness: There is no abdominal tenderness. There is no guarding.  Musculoskeletal: Normal range of motion.  Neurological:     General: No focal deficit present.     Mental Status: She is alert and oriented to person, place, and time.     Cranial Nerves: No cranial nerve deficit.  Skin:    General: Skin is warm and dry.  Psychiatric:        Mood and Affect: Mood normal.        Behavior: Behavior normal.        Thought Content:  Thought content normal.        Judgment: Judgment normal.  Vitals signs reviewed.     Assessment/Plan:  Encounter for annual routine gynecological examination  Cervical cancer screening - Plan: IGP, Aptima HPV  Screening for HPV (human papillomavirus) - Plan: IGP, Aptima HPV  Vaginal dryness, menopausal--try lubricants. Can add vag ERT prn.  Screening for breast cancer - Pt to sched mammo - Plan: MM 3D SCREEN BREAST BILATERAL  Essential hypertension - Controlled with meds. Rx RF [facility]Rxd to [facility]. REchk in 1 yr - Plan: Comprehensive metabolic panel, lisinopril-hydrochlorothiazide (ZESTORETIC) 20-12.5 MG tablet  Type 2 diabetes mellitus without complication, without long-term current use of insulin (HCC) - Rx RF metformin till lab repeat in 3 months. Diet/exercise/wt loss - Plan: Hemoglobin  A1c, lisinopril-hydrochlorothiazide (ZESTORETIC) 20-12.5 MG tablet, metFORMIN (GLUCOPHAGE) 500 MG tablet, pravastatin (PRAVACHOL) 20 MG tablet  Mixed hyperlipidemia - Rx RF till lab recheck in 3 months. Diet/exercise/wt loss - Plan: Lipid panel  Encounter for vitamin deficiency screening - Plan: VITAMIN D 25 Hydroxy (Vit-D Deficiency, Fractures)  Allergic rhinitis, unspecified seasonality, unspecified trigger - Rx RF xyzal to [facility]. F/u prn - Plan: levocetirizine (XYZAL) 5 MG tablet         Meds ordered this encounter  Medications  . levocetirizine (XYZAL) 5 MG tablet    Sig: Take 1 tablet (5 mg total) by mouth every evening.    Dispense:  90 tablet    Refill:  3    Order Specific Question:   Supervising Provider    Answer:   Gae Dry [id number]  . lisinopril-hydrochlorothiazide (ZESTORETIC) 20-12.5 MG tablet    Sig: Take 1 tablet by mouth daily.    Dispense:  90 tablet    Refill:  3    Order Specific Question:   Supervising Provider    Answer:   Gae Dry [id number]  . metFORMIN (GLUCOPHAGE) 500 MG tablet    Sig: Take 1 tablet (500 mg total) by mouth every evening.    Dispense:  90 tablet    Refill:  0    Order Specific Question:   Supervising Provider    Answer:   Gae Dry [id number]  . pravastatin (PRAVACHOL) 20 MG tablet    Sig: Take 1 tablet (20 mg total) by mouth daily.    Dispense:  90 tablet    Refill:  0    Order Specific Question:   Supervising Provider    Answer:   Gae Dry [id number]    GYN counsel mammography screening, adequate intake of calcium and vitamin D, diet and exercise    F/U  Return in about 1 year (around 01/29/2020).  Adeel Guiffre B. Lenford Beddow, PA-C 01/30/2019 10:58 AM

## 2019-01-29 NOTE — Patient Instructions (Signed)
I value your feedback and entrusting us with your care. If you get a [facility] patient survey, I would appreciate you taking the time to let us know about your experience today. Thank you!  [facility]: [phone]  [facility]: [phone]  

## 2019-01-30 DIAGNOSIS — N951 Menopausal and female climacteric states: Secondary | ICD-10-CM | POA: Insufficient documentation

## 2019-02-01 LAB — IGP, APTIMA HPV: HPV Aptima: NEGATIVE

## 2019-02-25 ENCOUNTER — Encounter: Payer: Self-pay | Admitting: Obstetrics and Gynecology

## 2019-02-26 ENCOUNTER — Encounter: Payer: Self-pay | Admitting: Obstetrics and Gynecology

## 2019-04-10 ENCOUNTER — Other Ambulatory Visit: Payer: Self-pay | Admitting: Obstetrics and Gynecology

## 2019-04-10 DIAGNOSIS — E119 Type 2 diabetes mellitus without complications: Secondary | ICD-10-CM

## 2019-04-18 ENCOUNTER — Other Ambulatory Visit: Payer: Self-pay | Admitting: Obstetrics and Gynecology

## 2019-04-18 DIAGNOSIS — E119 Type 2 diabetes mellitus without complications: Secondary | ICD-10-CM

## 2019-04-21 ENCOUNTER — Other Ambulatory Visit: Payer: Self-pay | Admitting: Obstetrics and Gynecology

## 2019-04-21 DIAGNOSIS — E119 Type 2 diabetes mellitus without complications: Secondary | ICD-10-CM

## 2019-04-21 MED ORDER — MELOXICAM 15 MG PO TABS: ORAL_TABLET | ORAL | 1 refills | Status: DC

## 2019-04-21 NOTE — Telephone Encounter (Signed)
advise

## 2019-04-21 NOTE — Progress Notes (Signed)
Rx RF meloxicam for arthritis.

## 2019-04-24 ENCOUNTER — Other Ambulatory Visit: Payer: 59

## 2019-04-24 ENCOUNTER — Other Ambulatory Visit: Payer: Self-pay

## 2019-04-24 DIAGNOSIS — E782 Mixed hyperlipidemia: Secondary | ICD-10-CM

## 2019-04-24 DIAGNOSIS — Z1321 Encounter for screening for nutritional disorder: Secondary | ICD-10-CM

## 2019-04-24 DIAGNOSIS — I10 Essential (primary) hypertension: Secondary | ICD-10-CM

## 2019-04-24 DIAGNOSIS — E119 Type 2 diabetes mellitus without complications: Secondary | ICD-10-CM

## 2019-04-25 ENCOUNTER — Encounter: Payer: Self-pay | Admitting: Obstetrics and Gynecology

## 2019-04-25 LAB — LIPID PANEL
Chol/HDL Ratio: 4 ratio (ref 0.0–4.4)
Cholesterol, Total: 164 mg/dL (ref 100–199)
HDL: 41 mg/dL (ref >39)
LDL Chol Calc (NIH): 100 mg/dL — ABNORMAL HIGH (ref 0–99)
Triglycerides: 128 mg/dL (ref 0–149)
VLDL Cholesterol Cal: 23 mg/dL (ref 5–40)

## 2019-04-25 LAB — COMPREHENSIVE METABOLIC PANEL
ALT: 10 IU/L (ref 0–32)
AST: 8 IU/L (ref 0–40)
Albumin/Globulin Ratio: 2.5 — ABNORMAL HIGH (ref 1.2–2.2)
Albumin: 4.3 g/dL (ref 3.8–4.9)
Alkaline Phosphatase: 69 IU/L (ref 39–117)
BUN/Creatinine Ratio: 18 (ref 9–23)
BUN: 14 mg/dL (ref 6–24)
Bilirubin Total: 0.3 mg/dL (ref 0.0–1.2)
CO2: 25 mmol/L (ref 20–29)
Calcium: 9.7 mg/dL (ref 8.7–10.2)
Chloride: 103 mmol/L (ref 96–106)
Creatinine, Ser: 0.8 mg/dL (ref 0.57–1.00)
GFR calc Af Amer: 97 mL/min/{1.73_m2} (ref >59)
GFR calc non Af Amer: 84 mL/min/{1.73_m2} (ref >59)
Globulin, Total: 1.7 g/dL (ref 1.5–4.5)
Glucose: 109 mg/dL — ABNORMAL HIGH (ref 65–99)
Potassium: 4.1 mmol/L (ref 3.5–5.2)
Sodium: 142 mmol/L (ref 134–144)
Total Protein: 6 g/dL (ref 6.0–8.5)

## 2019-04-25 LAB — HEMOGLOBIN A1C
Est. average glucose Bld gHb Est-mCnc: 117 mg/dL
Hgb A1c MFr Bld: 5.7 % — ABNORMAL HIGH (ref 4.8–5.6)

## 2019-04-25 LAB — VITAMIN D 25 HYDROXY (VIT D DEFICIENCY, FRACTURES): Vit D, 25-Hydroxy: 40.7 ng/mL (ref 30.0–100.0)

## 2019-04-25 MED ORDER — PRAVASTATIN SODIUM 20 MG PO TABS: 20.0000 mg | ORAL_TABLET | Freq: Every day | ORAL | 2 refills | Status: DC

## 2019-04-25 MED ORDER — METFORMIN HCL 500 MG PO TABS: 500.0000 mg | ORAL_TABLET | Freq: Every evening | ORAL | 1 refills | Status: DC

## 2019-04-25 NOTE — Progress Notes (Signed)
Rx RF metformin. HgA1C is 5.7%. Rechk in 6 months. Rx RF pravastatin. Lipids WNL. Rechk at 8/21 annual

## 2019-04-25 NOTE — Addendum Note (Signed)
Addended by: Ardeth Perfect B on: [id number] 10:32 AM   Modules accepted: Orders

## 2019-09-06 ENCOUNTER — Other Ambulatory Visit: Payer: Self-pay | Admitting: Obstetrics and Gynecology

## 2019-09-06 DIAGNOSIS — E119 Type 2 diabetes mellitus without complications: Secondary | ICD-10-CM

## 2019-09-22 ENCOUNTER — Other Ambulatory Visit: Payer: Self-pay | Admitting: Obstetrics and Gynecology

## 2019-09-22 DIAGNOSIS — E119 Type 2 diabetes mellitus without complications: Secondary | ICD-10-CM

## 2019-10-22 ENCOUNTER — Telehealth: Payer: Self-pay

## 2019-10-22 ENCOUNTER — Other Ambulatory Visit: Payer: Self-pay | Admitting: Obstetrics and Gynecology

## 2019-10-22 DIAGNOSIS — I10 Essential (primary) hypertension: Secondary | ICD-10-CM

## 2019-10-22 DIAGNOSIS — E119 Type 2 diabetes mellitus without complications: Secondary | ICD-10-CM

## 2019-10-22 MED ORDER — LISINOPRIL-HYDROCHLOROTHIAZIDE 20-12.5 MG PO TABS: 1.0000 | ORAL_TABLET | Freq: Every day | ORAL | 0 refills | Status: DC

## 2019-10-22 NOTE — Progress Notes (Signed)
Rx lisinopril/HCTZ to [facility] since pt on vacation there and out of pills.

## 2019-10-22 NOTE — Telephone Encounter (Signed)
Pls let pt know lisinopril eRxd. Thx

## 2019-10-22 NOTE — Telephone Encounter (Signed)
Patient is in [place], [place]. She did not take enough Lisinopril with her for the duration of her trip. She needs a couple of extra pills to get thru the end of the vacation. Inquiring if she can have some called in to [facility], [place] [address] [phone]. This is her primary concern. She also needs a couple of pills of metformin, but will be ok without those until she gets home. [phone]

## 2019-10-22 NOTE — Telephone Encounter (Signed)
Pt aware.

## 2019-10-28 NOTE — Telephone Encounter (Signed)
Pls let pt know DM labs already in system and she is past due. Can sched lab appt at her convenience.

## 2019-10-28 NOTE — Telephone Encounter (Signed)
Pt transferred to make appointment

## 2019-10-28 NOTE — Telephone Encounter (Signed)
Will lab orders be placed for pt?

## 2019-11-04 ENCOUNTER — Other Ambulatory Visit: Payer: Self-pay

## 2019-11-04 ENCOUNTER — Other Ambulatory Visit: Payer: No Typology Code available for payment source

## 2019-11-04 DIAGNOSIS — E119 Type 2 diabetes mellitus without complications: Secondary | ICD-10-CM

## 2019-11-05 ENCOUNTER — Other Ambulatory Visit: Payer: Self-pay | Admitting: Obstetrics and Gynecology

## 2019-11-05 DIAGNOSIS — E119 Type 2 diabetes mellitus without complications: Secondary | ICD-10-CM

## 2019-11-05 LAB — HEMOGLOBIN A1C
Est. average glucose Bld gHb Est-mCnc: 126 mg/dL
Hgb A1c MFr Bld: 6 % — ABNORMAL HIGH (ref 4.8–5.6)

## 2019-11-05 MED ORDER — METFORMIN HCL 500 MG PO TABS: 500.0000 mg | ORAL_TABLET | Freq: Every evening | ORAL | 1 refills | Status: DC

## 2019-11-05 NOTE — Progress Notes (Signed)
Rx RF metformin. Repeat labs in 6 months.

## 2019-11-13 ENCOUNTER — Encounter: Payer: Self-pay | Admitting: Obstetrics and Gynecology

## 2019-11-13 DIAGNOSIS — Z1152 Encounter for screening for COVID-19: Secondary | ICD-10-CM

## 2019-11-13 DIAGNOSIS — Z0000 Encounter for general adult medical examination without abnormal findings: Secondary | ICD-10-CM

## 2019-12-12 ENCOUNTER — Other Ambulatory Visit: Payer: Self-pay | Admitting: Obstetrics and Gynecology

## 2019-12-12 DIAGNOSIS — E119 Type 2 diabetes mellitus without complications: Secondary | ICD-10-CM

## 2019-12-26 ENCOUNTER — Other Ambulatory Visit: Payer: Self-pay | Admitting: Obstetrics and Gynecology

## 2019-12-26 DIAGNOSIS — E119 Type 2 diabetes mellitus without complications: Secondary | ICD-10-CM

## 2019-12-26 DIAGNOSIS — I10 Essential (primary) hypertension: Secondary | ICD-10-CM

## 2020-01-11 ENCOUNTER — Other Ambulatory Visit: Payer: Self-pay | Admitting: Obstetrics and Gynecology

## 2020-01-11 DIAGNOSIS — I10 Essential (primary) hypertension: Secondary | ICD-10-CM

## 2020-01-11 DIAGNOSIS — E119 Type 2 diabetes mellitus without complications: Secondary | ICD-10-CM

## 2020-01-13 ENCOUNTER — Other Ambulatory Visit: Payer: No Typology Code available for payment source

## 2020-01-13 ENCOUNTER — Other Ambulatory Visit: Payer: Self-pay

## 2020-01-13 DIAGNOSIS — Z1152 Encounter for screening for COVID-19: Secondary | ICD-10-CM

## 2020-01-13 DIAGNOSIS — Z0000 Encounter for general adult medical examination without abnormal findings: Secondary | ICD-10-CM

## 2020-01-14 LAB — LIPID PANEL
Chol/HDL Ratio: 4 ratio (ref 0.0–4.4)
Cholesterol, Total: 189 mg/dL (ref 100–199)
HDL: 47 mg/dL (ref >39)
LDL Chol Calc (NIH): 124 mg/dL — ABNORMAL HIGH (ref 0–99)
Triglycerides: 97 mg/dL (ref 0–149)
VLDL Cholesterol Cal: 18 mg/dL (ref 5–40)

## 2020-01-14 LAB — COMPREHENSIVE METABOLIC PANEL
ALT: 7 IU/L (ref 0–32)
AST: 8 IU/L (ref 0–40)
Albumin/Globulin Ratio: 2.3 — ABNORMAL HIGH (ref 1.2–2.2)
Albumin: 4.4 g/dL (ref 3.8–4.9)
Alkaline Phosphatase: 62 IU/L (ref 48–121)
BUN/Creatinine Ratio: 17 (ref 9–23)
BUN: 14 mg/dL (ref 6–24)
Bilirubin Total: 0.4 mg/dL (ref 0.0–1.2)
CO2: 27 mmol/L (ref 20–29)
Calcium: 9.4 mg/dL (ref 8.7–10.2)
Chloride: 103 mmol/L (ref 96–106)
Creatinine, Ser: 0.82 mg/dL (ref 0.57–1.00)
GFR calc Af Amer: 93 mL/min/{1.73_m2} (ref >59)
GFR calc non Af Amer: 81 mL/min/{1.73_m2} (ref >59)
Globulin, Total: 1.9 g/dL (ref 1.5–4.5)
Glucose: 109 mg/dL — ABNORMAL HIGH (ref 65–99)
Potassium: 4.1 mmol/L (ref 3.5–5.2)
Sodium: 142 mmol/L (ref 134–144)
Total Protein: 6.3 g/dL (ref 6.0–8.5)

## 2020-01-14 LAB — SARS-COV-2 SEMI-QUANTITATIVE TOTAL ANTIBODY, SPIKE
SARS-CoV-2 Semi-Quant Total Ab: <0.4 U/mL (ref <0.8)
SARS-CoV-2 Spike Ab Interp: NEGATIVE

## 2020-01-19 ENCOUNTER — Encounter: Payer: Self-pay | Admitting: Obstetrics and Gynecology

## 2020-02-01 NOTE — Progress Notes (Signed)
Chief Complaint  Patient presents with  . Gynecologic Exam  . [facility] Employee    HPI:      [name] is a 54 y.o. No obstetric history on file. who LMP was Patient's last menstrual period was 11/06/2014., presents today for her annual examination.  Her menses are absent. She does not have intermenstrual bleeding. She does have occas tolerable vasomotor sx.   Sex activity: single partner, contraception - post menopausal status. She does have vaginal dryness occas. Has not tried lubricants.  Last Pap: 01/29/19  Results were: no abnormalities /neg HPV DNA.  Hx of STDs: none  Last mammogram: 02/25/19 [facility], Results were: normal--routine follow-up in 12 months There is a FH of breast cancer in her pat cousin. There is a FH of ovarian cancer in her cousin and possibly mat aunt. Vistaseq testing was neg 2018. The patient does do self-breast exams.  Colonoscopy: colonoscopy 10/19 with polyps, hx of polyps on prior colonoscopy too. Repeat due after ? 3-5 yrs. Done by Dr. Vira Agar. Pt will check with GI this yr or next to see when repeat due.  Tobacco use: The patient denies current or previous tobacco use. Alcohol use: none  Drug use: none Exercise: moderately active  She does get adequate calcium and Vitamin D in her diet.  She has HTN and is doing well on lisinopril/HCTZ. She has type 2 DM, HgA1C 6/21 was 6.0. Taking metformin 500 mg QD. Not doing as well with wt watchers since Covid but has restarted. She gets eye exams yearly but didn't do last yr due to covid. Lipids good on pravastatin 20 mg daily 8/2. No side effects with meds. Repeat labs due 12/21. Needs Rx RF.  Recent Results (from the past 2160 hour(s))  Comprehensive metabolic panel     Status: Abnormal   Collection Time: 01/13/20  8:26 AM  Result Value Ref Range   Glucose 109 (H) 65 - 99 mg/dL   BUN 14 6 - 24 mg/dL   Creatinine, Ser 0.82 0.57 - 1.00 mg/dL   GFR calc non Af Amer 81 >59 mL/min/1.73   GFR calc Af  Amer 93 >59 mL/min/1.73    Comment: **[facility] currently reports eGFR in compliance with the current**   recommendations of the [employer]. [facility] will   update reporting as new guidelines are published from the NKF-ASN   Task force.    BUN/Creatinine Ratio 17 9 - 23   Sodium 142 134 - 144 mmol/L   Potassium 4.1 3.5 - 5.2 mmol/L   Chloride 103 96 - 106 mmol/L   CO2 27 20 - 29 mmol/L   Calcium 9.4 8.7 - 10.2 mg/dL   Total Protein 6.3 6.0 - 8.5 g/dL   Albumin 4.4 3.8 - 4.9 g/dL   Globulin, Total 1.9 1.5 - 4.5 g/dL   Albumin/Globulin Ratio 2.3 (H) 1.2 - 2.2   Bilirubin Total 0.4 0.0 - 1.2 mg/dL   Alkaline Phosphatase 62 48 - 121 IU/L   AST 8 0 - 40 IU/L   ALT 7 0 - 32 IU/L  Lipid panel     Status: Abnormal   Collection Time: 01/13/20  8:26 AM  Result Value Ref Range   Cholesterol, Total 189 100 - 199 mg/dL   Triglycerides 97 0 - 149 mg/dL   HDL 47 >39 mg/dL   VLDL Cholesterol Cal 18 5 - 40 mg/dL   LDL Chol Calc (NIH) 124 (H) 0 - 99 mg/dL   Chol/HDL  Ratio 4.0 0.0 - 4.4 ratio    Comment:                                   T. Chol/HDL Ratio                                             Men  Women                               1/2 Avg.Risk  3.4    3.3                                   Avg.Risk  5.0    4.4                                2X Avg.Risk  9.6    7.1                                3X Avg.Risk 23.4   11.0   SARS-CoV-2 Semi-Quantitative Total Antibody, Spike     Status: None   Collection Time: 01/13/20  8:35 AM  Result Value Ref Range   SARS-CoV-2 Semi-Quant Total Ab <0.4 <0.8 U/mL    Comment: This sample does not contain detectable antibodies against the SARS-CoV-2 spike protein receptor binding domain (RBD).                                    Interpretation:                                      Negative    <0.8 Sample does NOT contain detectable antibodies against the SARS-Cov-2 spike protein receptor binding domain (RBD).                                       Positive    >0.7 Sample contains detectable antibodies against the SARS-Cov-2 spike protein receptor binding domain (RBD).    SARS-CoV-2 Spike Ab Interp Negative      Past Medical History:  Diagnosis Date  . Abnormal mammogram 2012   resolved  . Arthritis   . Complication of anesthesia   . Diabetes mellitus without complication ([place])   . Family history of adverse reaction to anesthesia    mom-hard to wake up, n/v  . Family history of ovarian cancer   . Fundic gland polyps of stomach, benign   . Glaucoma   . History of mammogram 01/27/2016   Birads 1  . History of Papanicolaou smear of cervix 01/17/2016   NIL/NEG  . Hypertension   . IBS (irritable bowel syndrome)   . Mixed hyperlipidemia   . Osteoarthritis   . PONV (postoperative nausea and vomiting)    n/v  . Rhinitis, allergic   . Sleep apnea  does not use CPAP  . Testing of female for genetic disease carrier status 02/2016   VISTASEQ Neg  . Torn meniscus    RIGHT KNEE-PT STATES IT HAS HEALED BUT STILL HAS WEAKNESS AND PAIN IN KNEE  . Type 2 diabetes mellitus ([place])     Past Surgical History:  Procedure Laterality Date  . COLONOSCOPY  07/2009;2014   benign polyps neg  . COLONOSCOPY WITH PROPOFOL N/A 03/11/2018   Procedure: COLONOSCOPY WITH PROPOFOL;  Surgeon: Manya Silvas, MD;  Location[facility] ENDOSCOPY;  Service: Endoscopy;  Laterality: N/A;  . DILATION AND CURETTAGE OF UTERUS    . ESOPHAGOGASTRODUODENOSCOPY N/A 11/06/2014   Procedure: ESOPHAGOGASTRODUODENOSCOPY (EGD);  Surgeon: Josefine Class, MD;  Location: [facility] ENDOSCOPY;  Service: Endoscopy;  Laterality: N/A;  With Small Bowel Biopsies  . GANGLION CYST EXCISION Left     Family History  Problem Relation Age of Onset  . Hypertension Mother   . Diabetes Father   . Heart disease Father   . Hypertension Father   . Breast cancer Cousin 28  . Ovarian cancer Maternal Aunt 70  . Ovarian cancer Cousin 30    Social History   Socioeconomic History   . Marital status: Married    Spouse name: Not on file  . Number of children: Not on file  . Years of education: Not on file  . Highest education level: Not on file  Occupational History  . Not on file  Tobacco Use  . Smoking status: Never Smoker  . Smokeless tobacco: Never Used  Vaping Use  . Vaping Use: Never used  Substance and Sexual Activity  . Alcohol use: Yes  . Drug use: No  . Sexual activity: Yes    Birth control/protection: Post-menopausal  Other Topics Concern  . Not on file  Social History Narrative  . Not on file   Social Determinants of Health   Financial Resource Strain:   . Difficulty of Paying Living Expenses: Not on file  Food Insecurity:   . Worried About Charity fundraiser in the Last Year: Not on file  . Ran Out of Food in the Last Year: Not on file  Transportation Needs:   . Lack of Transportation (Medical): Not on file  . Lack of Transportation (Non-Medical): Not on file  Physical Activity:   . Days of Exercise per Week: Not on file  . Minutes of Exercise per Session: Not on file  Stress:   . Feeling of Stress : Not on file  Social Connections:   . Frequency of Communication with Friends and Family: Not on file  . Frequency of Social Gatherings with Friends and Family: Not on file  . Attends Religious Services: Not on file  . Active Member of Clubs or Organizations: Not on file  . Attends Archivist Meetings: Not on file  . Marital Status: Not on file  Intimate Partner Violence:   . Fear of Current or Ex-Partner: Not on file  . Emotionally Abused: Not on file  . Physically Abused: Not on file  . Sexually Abused: Not on file     Current Outpatient Medications:  .  Cholecalciferol 50 MCG (2000 UT) CAPS, Vitamin D3 50 mcg (2,000 unit) capsule, Disp: , Rfl:  .  fluticasone (FLONASE) 50 MCG/ACT nasal spray, USE 2 SPRAYS IN EACH  NOSTRIL DAILY AS NEEDED FOR ALLERGIES OR RHINITIS, Disp: 48 g, Rfl: 0 .  glucose blood test strip,  OneTouch Verio strips, Disp: , Rfl:  .  levocetirizine (XYZAL) 5 MG tablet, Take 1 tablet (5 mg total) by mouth every evening., Disp: 90 tablet, Rfl: 3 .  lisinopril-hydrochlorothiazide (ZESTORETIC) 20-12.5 MG tablet, Take 1 tablet by mouth daily., Disp: 90 tablet, Rfl: 3 .  meloxicam (MOBIC) 15 MG tablet, TAKE 1 TABLET BY MOUTH ONCE A DAY AS NEEDED FOR PAIN, Disp: 90 tablet, Rfl: 1 .  metFORMIN (GLUCOPHAGE) 500 MG tablet, Take 1 tablet (500 mg total) by mouth every evening., Disp: 90 tablet, Rfl: 1 .  ONETOUCH DELICA LANCETS 75Z MISC, OneTouch Delica Lancets 33 gauge, Disp: , Rfl:  .  pravastatin (PRAVACHOL) 20 MG tablet, Take 1 tablet (20 mg total) by mouth daily., Disp: 90 tablet, Rfl: 1 .  triamcinolone cream (KENALOG) 0.1 %, APPLY ON THE SKIN AS DIRECTED TO AFFECTED ELBOWS 1-2 TIMES A DAY AS NEEDED AVOID FACE/GROIN/AXILLA., Disp: , Rfl: 2 .  BIOTIN PO, Take 2 each by mouth daily. (Patient not taking: Reported on 02/02/2020), Disp: , Rfl:    ROS:  Review of Systems  Constitutional: Negative for fatigue, fever and unexpected weight change.  Respiratory: Negative for cough, shortness of breath and wheezing.   Cardiovascular: Negative for chest pain, palpitations and leg swelling.  Gastrointestinal: Negative for abdominal pain, blood in stool, constipation, diarrhea, nausea and vomiting.  Endocrine: Negative for cold intolerance, heat intolerance and polyuria.  Genitourinary: Negative for dyspareunia, dysuria, flank pain, frequency, genital sores, hematuria, menstrual problem, pelvic pain, urgency, vaginal bleeding, vaginal discharge and vaginal pain.  Musculoskeletal: Negative for arthralgias, back pain, joint swelling and myalgias.  Skin: Negative for rash.  Neurological: Negative for dizziness, syncope, light-headedness, numbness and headaches.  Hematological: Negative for adenopathy.  Psychiatric/Behavioral: Negative for agitation, confusion, sleep disturbance and suicidal ideas. The  patient is not nervous/anxious.     Objective: BP 100/70   Ht 5' (1.524 m)   Wt 171 lb (77.6 kg)   LMP 11/06/2014   BMI 33.40 kg/m    Physical Exam Constitutional:      Appearance: She is well-developed.  Genitourinary:     Vulva, vagina, uterus, right adnexa and left adnexa normal.     No vulval lesion or tenderness noted.     No vaginal discharge, erythema or tenderness.     No cervical motion tenderness or polyp.     Uterus is not enlarged or tender.     No right or left adnexal mass present.     Right adnexa not tender.     Left adnexa not tender.  Neck:     Thyroid: No thyromegaly.  Cardiovascular:     Rate and Rhythm: Normal rate and regular rhythm.     Heart sounds: Normal heart sounds. No murmur heard.   Pulmonary:     Effort: Pulmonary effort is normal.     Breath sounds: Normal breath sounds.  Chest:     Breasts:        Right: No mass, nipple discharge, skin change or tenderness.        Left: No mass, nipple discharge, skin change or tenderness.  Abdominal:     Palpations: Abdomen is soft.     Tenderness: There is no abdominal tenderness. There is no guarding.  Musculoskeletal:        General: Normal range of motion.     Cervical back: Normal range of motion.  Neurological:     General: No focal deficit present.     Mental Status: She is alert and oriented to person, place, and time.  Cranial Nerves: No cranial nerve deficit.  Skin:    General: Skin is warm and dry.  Psychiatric:        Mood and Affect: Mood normal.        Behavior: Behavior normal.        Thought Content: Thought content normal.        Judgment: Judgment normal.  Vitals reviewed.     Assessment/Plan:  Encounter for annual routine gynecological examination  Screening for breast cancer - Has appt sched - Plan: MM 3D SCREEN BREAST BILATERAL  Essential hypertension - Controlled with meds. Rx RF eRxd to [facility]. REchk in 1 yr - Plan: Comprehensive metabolic panel,  lisinopril-hydrochlorothiazide (ZESTORETIC) 20-12.5 MG tablet  Type 2 diabetes mellitus without complication, without long-term current use of insulin (HCC) - Rx RF metformin till lab repeat in 12/21. Diet/exercise/wt loss - Plan: Hemoglobin A1c, lisinopril-hydrochlorothiazide (ZESTORETIC) 20-12.5 MG tablet, metFORMIN (GLUCOPHAGE) 500 MG tablet, pravastatin (PRAVACHOL) 20 MG tablet  Mixed hyperlipidemia - Rx RF till lab recheck 12/21. Diet/exercise/wt loss - Plan: Lipid panel  Meds ordered this encounter  Medications  . pravastatin (PRAVACHOL) 20 MG tablet    Sig: Take 1 tablet (20 mg total) by mouth daily.    Dispense:  90 tablet    Refill:  1    Requesting 1 year supply    Order Specific Question:   Supervising Provider    Answer:   Gae Dry [[id number]]  . metFORMIN (GLUCOPHAGE) 500 MG tablet    Sig: Take 1 tablet (500 mg total) by mouth every evening.    Dispense:  90 tablet    Refill:  1    Order Specific Question:   Supervising Provider    Answer:   Gae Dry [id number]  . lisinopril-hydrochlorothiazide (ZESTORETIC) 20-12.5 MG tablet    Sig: Take 1 tablet by mouth daily.    Dispense:  90 tablet    Refill:  3    Order Specific Question:   Supervising Provider    Answer:   Gae Dry [id number]  . levocetirizine (XYZAL) 5 MG tablet    Sig: Take 1 tablet (5 mg total) by mouth every evening.    Dispense:  90 tablet    Refill:  3    Order Specific Question:   Supervising Provider    Answer:   Gae Dry [[id number]]           GYN counsel mammography screening, adequate intake of calcium and vitamin D, diet and exercise    F/U  Return in about 1 year (around 02/01/2021).  Khady Vandenberg B. Jullisa Grigoryan, PA-C 02/02/2020 12:12 PM

## 2020-02-02 ENCOUNTER — Encounter: Payer: Self-pay | Admitting: Obstetrics and Gynecology

## 2020-02-02 ENCOUNTER — Ambulatory Visit (INDEPENDENT_AMBULATORY_CARE_PROVIDER_SITE_OTHER): Payer: No Typology Code available for payment source | Admitting: Obstetrics and Gynecology

## 2020-02-02 ENCOUNTER — Other Ambulatory Visit: Payer: Self-pay

## 2020-02-02 VITALS — BP 100/70 | Ht 60.0 in | Wt 171.0 lb

## 2020-02-02 DIAGNOSIS — J309 Allergic rhinitis, unspecified: Secondary | ICD-10-CM

## 2020-02-02 DIAGNOSIS — I10 Essential (primary) hypertension: Secondary | ICD-10-CM

## 2020-02-02 DIAGNOSIS — E119 Type 2 diabetes mellitus without complications: Secondary | ICD-10-CM | POA: Diagnosis not present

## 2020-02-02 DIAGNOSIS — Z1231 Encounter for screening mammogram for malignant neoplasm of breast: Secondary | ICD-10-CM

## 2020-02-02 DIAGNOSIS — Z01419 Encounter for gynecological examination (general) (routine) without abnormal findings: Secondary | ICD-10-CM

## 2020-02-02 MED ORDER — LISINOPRIL-HYDROCHLOROTHIAZIDE 20-12.5 MG PO TABS: 1.0000 | ORAL_TABLET | Freq: Every day | ORAL | 3 refills | Status: DC

## 2020-02-02 MED ORDER — METFORMIN HCL 500 MG PO TABS: 500.0000 mg | ORAL_TABLET | Freq: Every evening | ORAL | 1 refills | Status: DC

## 2020-02-02 MED ORDER — PRAVASTATIN SODIUM 20 MG PO TABS: 20.0000 mg | ORAL_TABLET | Freq: Every day | ORAL | 1 refills | Status: DC

## 2020-02-02 MED ORDER — LEVOCETIRIZINE DIHYDROCHLORIDE 5 MG PO TABS: 5.0000 mg | ORAL_TABLET | Freq: Every evening | ORAL | 3 refills | Status: DC

## 2020-02-02 NOTE — Patient Instructions (Signed)
I value your feedback and entrusting us with your care. If you get a [facility] patient survey, I would appreciate you taking the time to let us know about your experience today. Thank you!  As of May 15, 2019, your lab results will be released to your MyChart immediately, before I even have a chance to see them. Please give me time to review them and contact you if there are any abnormalities. Thank you for your patience.  

## 2020-02-04 DIAGNOSIS — D229 Melanocytic nevi, unspecified: Secondary | ICD-10-CM

## 2020-02-04 HISTORY — DX: Melanocytic nevi, unspecified: D22.9

## 2020-02-18 ENCOUNTER — Encounter: Payer: Self-pay | Admitting: Obstetrics and Gynecology

## 2020-02-19 ENCOUNTER — Ambulatory Visit: Payer: No Typology Code available for payment source | Admitting: Dermatology

## 2020-02-19 ENCOUNTER — Other Ambulatory Visit: Payer: Self-pay

## 2020-02-19 ENCOUNTER — Other Ambulatory Visit: Payer: Self-pay | Admitting: Dermatology

## 2020-02-19 DIAGNOSIS — Z1283 Encounter for screening for malignant neoplasm of skin: Secondary | ICD-10-CM

## 2020-02-19 DIAGNOSIS — D229 Melanocytic nevi, unspecified: Secondary | ICD-10-CM

## 2020-02-19 DIAGNOSIS — L570 Actinic keratosis: Secondary | ICD-10-CM

## 2020-02-19 DIAGNOSIS — L409 Psoriasis, unspecified: Secondary | ICD-10-CM

## 2020-02-19 DIAGNOSIS — L820 Inflamed seborrheic keratosis: Secondary | ICD-10-CM | POA: Diagnosis not present

## 2020-02-19 DIAGNOSIS — D485 Neoplasm of uncertain behavior of skin: Secondary | ICD-10-CM

## 2020-02-19 DIAGNOSIS — R21 Rash and other nonspecific skin eruption: Secondary | ICD-10-CM

## 2020-02-19 DIAGNOSIS — D1800 Hemangioma unspecified site: Secondary | ICD-10-CM

## 2020-02-19 DIAGNOSIS — L578 Other skin changes due to chronic exposure to nonionizing radiation: Secondary | ICD-10-CM

## 2020-02-19 DIAGNOSIS — L814 Other melanin hyperpigmentation: Secondary | ICD-10-CM

## 2020-02-19 DIAGNOSIS — L821 Other seborrheic keratosis: Secondary | ICD-10-CM

## 2020-02-19 MED ORDER — TAZAROTENE 0.1 % EX CREA: TOPICAL_CREAM | Freq: Every day | CUTANEOUS | 0 refills | Status: DC

## 2020-02-19 MED ORDER — TRIAMCINOLONE ACETONIDE 0.1 % EX CREA: TOPICAL_CREAM | CUTANEOUS | 2 refills | Status: DC

## 2020-02-19 NOTE — Patient Instructions (Signed)

## 2020-02-19 NOTE — Progress Notes (Signed)
Follow-Up Visit   Subjective  Teresa Moran is a 54 y.o. female who presents for the following: Annual Exam (TBSE - No history of skin cancer.) and Other (Check left 5th nail - had a traumatic injury). The patient presents for Total-Body Skin Exam (TBSE) for skin cancer screening and mole check.  The following portions of the chart were reviewed this encounter and updated as appropriate:  Tobacco  Allergies  Meds  Problems  Med Hx  Surg Hx  Fam Hx     Review of Systems:  No other skin or systemic complaints except as noted in HPI or Assessment and Plan.  Objective  Well appearing patient in no apparent distress; mood and affect are within normal limits.  A full examination was performed including scalp, head, eyes, ears, nose, lips, neck, chest, axillae, abdomen, back, buttocks, bilateral upper extremities, bilateral lower extremities, hands, feet, fingers, toes, fingernails, and toenails. All findings within normal limits unless otherwise noted below.  Objective  Bilateral elbows: Erythema and scale of bilateral elbows.  Objective  Left cheek: Erythematous thin papules/macules with gritty scale.   Objective  Mid Lower [place] Lip: Brown macules of lower lip.  Images      Objective  Right mid back paraspinal: 1.1 cm irregular brown macule  Objective  Right tricep: Erythematous keratotic or waxy stuck-on papule or plaque.    Assessment & Plan    Lentigines - Scattered tan macules - Discussed due to sun exposure - Benign, observe - Call for any changes  Seborrheic Keratoses - Stuck-on, waxy, tan-brown papules and plaques  - Discussed benign etiology and prognosis. - Observe - Call for any changes  Melanocytic Nevi - Tan-brown and/or pink-flesh-colored symmetric macules and papules - Benign appearing on exam today - Observation - Call clinic for new or changing moles - Recommend daily use of broad spectrum spf 30+ sunscreen to sun-exposed  areas.   Hemangiomas - Red papules - Discussed benign nature - Observe - Call for any changes  Actinic Damage - diffuse scaly erythematous macules with underlying dyspigmentation - Recommend daily broad spectrum sunscreen SPF 30+ to sun-exposed areas, reapply every 2 hours as needed.  - Call for new or changing lesions.  Skin cancer screening performed today.  Psoriasis Bilateral elbows Start Tazzorac cream qhs until scale is decrease then continue TMC 0.1% cream as needed. Sample of Duobrii given today.  tazarotene (TAZORAC) 0.1 % cream - Bilateral elbows  triamcinolone cream (KENALOG) 0.1 % - Bilateral elbows  AK (actinic keratosis) Left cheek  Destruction of lesion - Left cheek Complexity: simple   Destruction method: cryotherapy   Informed consent: discussed and consent obtained   Timeout:  patient name, date of birth, surgical site, and procedure verified Lesion destroyed using liquid nitrogen: Yes   Region frozen until ice ball extended beyond lesion: Yes   Outcome: patient tolerated procedure well with no complications   Post-procedure details: wound care instructions given    Rash - Lentigos of lips and 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  There is positive family history of this condition and the patient's mother with freckling of the lips.  This is a rare autosomal dominant condition characterized by mucocutaneous pigmentation and multiple intestinal hamartomatous polyps.  The Genetic defect is due to mutations in the serine/threonine kinase 11 (STK 11) gene. There is higher risk of colorectal and breast cancer as well as other cancers but this condition. The patient currently gets regular checks by her primary care  doctor and gets regular colonoscopies for evaluation. 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 Patient has had regular colonoscopies with no findings. Will review information on condition and will get  in touch with patient if she needs any further testing.  Neoplasm of uncertain behavior of skin Right mid back paraspinal  Epidermal / dermal shaving  Lesion diameter (cm):  1.1 Informed consent: discussed and consent obtained   Timeout: patient name, date of birth, surgical site, and procedure verified   Procedure prep:  Patient was prepped and draped in usual sterile fashion Prep type:  Isopropyl alcohol Anesthesia: the lesion was anesthetized in a standard fashion   Anesthetic:  1% lidocaine w/ epinephrine 1-100,000 buffered w/ 8.4% NaHCO3 Instrument used: flexible razor blade   Hemostasis achieved with: pressure, aluminum chloride and electrodesiccation   Outcome: patient tolerated procedure well   Post-procedure details: sterile dressing applied and wound care instructions given   Dressing type: bandage and petrolatum    Other Related Procedures Anatomic Pathology Report  Inflamed seborrheic keratosis Right tricep  Destruction of lesion - Right tricep Complexity: simple   Destruction method: cryotherapy   Informed consent: discussed and consent obtained   Timeout:  patient name, date of birth, surgical site, and procedure verified Lesion destroyed using liquid nitrogen: Yes   Region frozen until ice ball extended beyond lesion: Yes   Outcome: patient tolerated procedure well with no complications   Post-procedure details: wound care instructions given    Return in about 1 year (around 02/18/2021).  I, Ashok Cordia, CMA, am acting as scribe for Sarina Ser, MD .  Documentation: I have reviewed the above documentation for accuracy and completeness, and I agree with the above.  Sarina Ser, MD

## 2020-02-23 ENCOUNTER — Encounter: Payer: Self-pay | Admitting: Dermatology

## 2020-02-26 LAB — HM DIABETES EYE EXAM

## 2020-02-28 LAB — ANATOMIC PATHOLOGY REPORT

## 2020-03-02 ENCOUNTER — Encounter: Payer: Self-pay | Admitting: Obstetrics and Gynecology

## 2020-03-08 ENCOUNTER — Telehealth: Payer: Self-pay

## 2020-03-08 NOTE — Telephone Encounter (Signed)
Discussed biopsy results with pt  °

## 2020-03-08 NOTE — Telephone Encounter (Signed)
-----   Message from Ralene Bathe, MD sent at 03/08/2020 11:37 AM EDT ----- Diagnosis synopsis: Comment  Comment: Specimen 1-Skin Biopsy, Right Mid Back Paraspinal:  LENTIGINOUS COMPOUND MELANOCYTIC NEVUS WITH ARCHITECTURAL  DISORDER AND MODERATE CYTOLOGIC ATYPISM OF THE MELANOCYTES  (DYSPLASTIC NEVUS, MODERATE   Dysplastic Moderate Recheck at next visit

## 2020-07-13 ENCOUNTER — Encounter: Payer: Self-pay | Admitting: Obstetrics and Gynecology

## 2020-08-17 ENCOUNTER — Other Ambulatory Visit: Payer: Self-pay | Admitting: Obstetrics and Gynecology

## 2020-08-17 DIAGNOSIS — E119 Type 2 diabetes mellitus without complications: Secondary | ICD-10-CM

## 2020-08-30 ENCOUNTER — Other Ambulatory Visit: Payer: Self-pay | Admitting: Obstetrics and Gynecology

## 2020-08-30 ENCOUNTER — Other Ambulatory Visit: Payer: Self-pay | Admitting: Dermatology

## 2020-08-30 DIAGNOSIS — L409 Psoriasis, unspecified: Secondary | ICD-10-CM

## 2020-08-30 DIAGNOSIS — E119 Type 2 diabetes mellitus without complications: Secondary | ICD-10-CM

## 2020-09-02 ENCOUNTER — Other Ambulatory Visit: Payer: Self-pay | Admitting: Obstetrics and Gynecology

## 2020-09-02 DIAGNOSIS — E119 Type 2 diabetes mellitus without complications: Secondary | ICD-10-CM

## 2020-09-28 ENCOUNTER — Encounter: Payer: Self-pay | Admitting: Obstetrics and Gynecology

## 2020-10-05 ENCOUNTER — Other Ambulatory Visit: Payer: No Typology Code available for payment source

## 2020-10-07 ENCOUNTER — Other Ambulatory Visit: Payer: Self-pay

## 2020-10-07 ENCOUNTER — Other Ambulatory Visit: Payer: No Typology Code available for payment source

## 2020-10-07 DIAGNOSIS — E119 Type 2 diabetes mellitus without complications: Secondary | ICD-10-CM

## 2020-10-07 DIAGNOSIS — I10 Essential (primary) hypertension: Secondary | ICD-10-CM

## 2020-10-08 LAB — COMPREHENSIVE METABOLIC PANEL
ALT: 9 IU/L (ref 0–32)
AST: 12 IU/L (ref 0–40)
Albumin/Globulin Ratio: 3.1 — ABNORMAL HIGH (ref 1.2–2.2)
Albumin: 4.4 g/dL (ref 3.8–4.9)
Alkaline Phosphatase: 59 IU/L (ref 44–121)
BUN/Creatinine Ratio: 13 (ref 9–23)
BUN: 12 mg/dL (ref 6–24)
Bilirubin Total: 0.3 mg/dL (ref 0.0–1.2)
CO2: 23 mmol/L (ref 20–29)
Calcium: 9.5 mg/dL (ref 8.7–10.2)
Chloride: 102 mmol/L (ref 96–106)
Creatinine, Ser: 0.92 mg/dL (ref 0.57–1.00)
Globulin, Total: 1.4 g/dL — ABNORMAL LOW (ref 1.5–4.5)
Glucose: 116 mg/dL — ABNORMAL HIGH (ref 65–99)
Potassium: 4.1 mmol/L (ref 3.5–5.2)
Sodium: 140 mmol/L (ref 134–144)
Total Protein: 5.8 g/dL — ABNORMAL LOW (ref 6.0–8.5)
eGFR: 74 mL/min/{1.73_m2} (ref >59)

## 2020-10-08 LAB — PROTEIN / CREATININE RATIO, URINE
Creatinine, Urine: 112.1 mg/dL
Protein, Ur: 6.9 mg/dL
Protein/Creat Ratio: 62 mg/g creat (ref 0–200)

## 2020-10-08 LAB — LIPID PANEL
Chol/HDL Ratio: 3.7 ratio (ref 0.0–4.4)
Cholesterol, Total: 183 mg/dL (ref 100–199)
HDL: 50 mg/dL (ref >39)
LDL Chol Calc (NIH): 119 mg/dL — ABNORMAL HIGH (ref 0–99)
Triglycerides: 77 mg/dL (ref 0–149)
VLDL Cholesterol Cal: 14 mg/dL (ref 5–40)

## 2020-10-08 LAB — HEMOGLOBIN A1C
Est. average glucose Bld gHb Est-mCnc: 128 mg/dL
Hgb A1c MFr Bld: 6.1 % — ABNORMAL HIGH (ref 4.8–5.6)

## 2020-10-11 ENCOUNTER — Other Ambulatory Visit: Payer: Self-pay | Admitting: Obstetrics and Gynecology

## 2020-10-11 DIAGNOSIS — E119 Type 2 diabetes mellitus without complications: Secondary | ICD-10-CM

## 2020-10-11 MED ORDER — METFORMIN HCL 500 MG PO TABS: 500.0000 mg | ORAL_TABLET | Freq: Every evening | ORAL | 0 refills | Status: DC

## 2020-10-11 NOTE — Progress Notes (Signed)
Rx RF metformin till 8/22 annual.

## 2020-11-15 ENCOUNTER — Encounter: Payer: Self-pay | Admitting: Obstetrics and Gynecology

## 2020-11-15 DIAGNOSIS — Z0000 Encounter for general adult medical examination without abnormal findings: Secondary | ICD-10-CM

## 2020-11-15 DIAGNOSIS — Z1152 Encounter for screening for COVID-19: Secondary | ICD-10-CM

## 2020-11-15 DIAGNOSIS — E119 Type 2 diabetes mellitus without complications: Secondary | ICD-10-CM

## 2020-11-15 DIAGNOSIS — I10 Essential (primary) hypertension: Secondary | ICD-10-CM

## 2020-12-03 ENCOUNTER — Other Ambulatory Visit: Payer: Self-pay | Admitting: Obstetrics and Gynecology

## 2020-12-03 DIAGNOSIS — E119 Type 2 diabetes mellitus without complications: Secondary | ICD-10-CM

## 2020-12-03 DIAGNOSIS — I10 Essential (primary) hypertension: Secondary | ICD-10-CM

## 2020-12-05 ENCOUNTER — Other Ambulatory Visit: Payer: Self-pay | Admitting: Obstetrics and Gynecology

## 2020-12-05 DIAGNOSIS — E119 Type 2 diabetes mellitus without complications: Secondary | ICD-10-CM

## 2021-01-10 ENCOUNTER — Other Ambulatory Visit: Payer: No Typology Code available for payment source

## 2021-01-17 ENCOUNTER — Other Ambulatory Visit: Payer: No Typology Code available for payment source

## 2021-01-18 ENCOUNTER — Other Ambulatory Visit: Payer: No Typology Code available for payment source

## 2021-01-18 ENCOUNTER — Other Ambulatory Visit: Payer: Self-pay

## 2021-01-18 DIAGNOSIS — I10 Essential (primary) hypertension: Secondary | ICD-10-CM

## 2021-01-18 DIAGNOSIS — Z1152 Encounter for screening for COVID-19: Secondary | ICD-10-CM

## 2021-01-18 DIAGNOSIS — Z0000 Encounter for general adult medical examination without abnormal findings: Secondary | ICD-10-CM

## 2021-01-18 DIAGNOSIS — E119 Type 2 diabetes mellitus without complications: Secondary | ICD-10-CM

## 2021-01-19 ENCOUNTER — Other Ambulatory Visit: Payer: Self-pay | Admitting: Obstetrics and Gynecology

## 2021-01-19 DIAGNOSIS — E119 Type 2 diabetes mellitus without complications: Secondary | ICD-10-CM

## 2021-01-19 DIAGNOSIS — I10 Essential (primary) hypertension: Secondary | ICD-10-CM

## 2021-01-19 LAB — LIPID PANEL
Chol/HDL Ratio: 3.9 ratio (ref 0.0–4.4)
Cholesterol, Total: 193 mg/dL (ref 100–199)
HDL: 49 mg/dL (ref >39)
LDL Chol Calc (NIH): 125 mg/dL — ABNORMAL HIGH (ref 0–99)
Triglycerides: 104 mg/dL (ref 0–149)
VLDL Cholesterol Cal: 19 mg/dL (ref 5–40)

## 2021-01-19 LAB — COMPREHENSIVE METABOLIC PANEL
ALT: 10 IU/L (ref 0–32)
AST: 13 IU/L (ref 0–40)
Albumin/Globulin Ratio: 2.4 — ABNORMAL HIGH (ref 1.2–2.2)
Albumin: 4.6 g/dL (ref 3.8–4.9)
Alkaline Phosphatase: 64 IU/L (ref 44–121)
BUN/Creatinine Ratio: 15 (ref 9–23)
BUN: 13 mg/dL (ref 6–24)
Bilirubin Total: 0.5 mg/dL (ref 0.0–1.2)
CO2: 25 mmol/L (ref 20–29)
Calcium: 9.4 mg/dL (ref 8.7–10.2)
Chloride: 102 mmol/L (ref 96–106)
Creatinine, Ser: 0.84 mg/dL (ref 0.57–1.00)
Globulin, Total: 1.9 g/dL (ref 1.5–4.5)
Glucose: 125 mg/dL — ABNORMAL HIGH (ref 65–99)
Potassium: 3.9 mmol/L (ref 3.5–5.2)
Sodium: 141 mmol/L (ref 134–144)
Total Protein: 6.5 g/dL (ref 6.0–8.5)
eGFR: 82 mL/min/{1.73_m2} (ref >59)

## 2021-01-19 LAB — SARS-COV-2 SEMI-QUANTITATIVE TOTAL ANTIBODY, SPIKE
SARS-CoV-2 Semi-Quant Total Ab: 14.1 U/mL (ref <0.8)
SARS-CoV-2 Spike Ab Interp: POSITIVE

## 2021-01-19 LAB — HEMOGLOBIN A1C
Est. average glucose Bld gHb Est-mCnc: 123 mg/dL
Hgb A1c MFr Bld: 5.9 % — ABNORMAL HIGH (ref 4.8–5.6)

## 2021-01-19 MED ORDER — METFORMIN HCL 500 MG PO TABS: 500.0000 mg | ORAL_TABLET | Freq: Every evening | ORAL | 1 refills | Status: DC

## 2021-01-19 MED ORDER — PRAVASTATIN SODIUM 20 MG PO TABS
20.0000 mg | ORAL_TABLET | Freq: Every day | ORAL | 3 refills | Status: DC
Start: 2021-01-19 — End: 2022-02-07

## 2021-01-19 MED ORDER — LISINOPRIL-HYDROCHLOROTHIAZIDE 20-12.5 MG PO TABS: 1.0000 | ORAL_TABLET | Freq: Every day | ORAL | 3 refills | Status: DC

## 2021-01-19 NOTE — Progress Notes (Signed)
Rx RF lisinopril/HCTZ for 1 yr. Had BP of 100/60 at lab draw appt.

## 2021-01-19 NOTE — Progress Notes (Signed)
Rx RF pravastatin and metformin to mail order. Labs stable 8/22, repeat due 2/23

## 2021-02-02 ENCOUNTER — Ambulatory Visit (INDEPENDENT_AMBULATORY_CARE_PROVIDER_SITE_OTHER): Payer: No Typology Code available for payment source | Admitting: Obstetrics and Gynecology

## 2021-02-02 ENCOUNTER — Encounter: Payer: Self-pay | Admitting: Obstetrics and Gynecology

## 2021-02-02 ENCOUNTER — Other Ambulatory Visit: Payer: Self-pay

## 2021-02-02 VITALS — BP 130/70 | Ht 61.0 in | Wt 175.0 lb

## 2021-02-02 DIAGNOSIS — Z1231 Encounter for screening mammogram for malignant neoplasm of breast: Secondary | ICD-10-CM | POA: Diagnosis not present

## 2021-02-02 DIAGNOSIS — E119 Type 2 diabetes mellitus without complications: Secondary | ICD-10-CM

## 2021-02-02 DIAGNOSIS — Z1211 Encounter for screening for malignant neoplasm of colon: Secondary | ICD-10-CM | POA: Diagnosis not present

## 2021-02-02 DIAGNOSIS — M25562 Pain in left knee: Secondary | ICD-10-CM

## 2021-02-02 DIAGNOSIS — J309 Allergic rhinitis, unspecified: Secondary | ICD-10-CM

## 2021-02-02 DIAGNOSIS — Z01419 Encounter for gynecological examination (general) (routine) without abnormal findings: Secondary | ICD-10-CM | POA: Diagnosis not present

## 2021-02-02 DIAGNOSIS — M25561 Pain in right knee: Secondary | ICD-10-CM

## 2021-02-02 DIAGNOSIS — I10 Essential (primary) hypertension: Secondary | ICD-10-CM

## 2021-02-02 MED ORDER — LEVOCETIRIZINE DIHYDROCHLORIDE 5 MG PO TABS: 5.0000 mg | ORAL_TABLET | Freq: Every evening | ORAL | 3 refills | Status: DC

## 2021-02-02 MED ORDER — MELOXICAM 15 MG PO TABS: ORAL_TABLET | ORAL | 1 refills | Status: DC

## 2021-02-02 NOTE — Patient Instructions (Signed)
I value your feedback and you entrusting us with your care. If you get a [facility] patient survey, I would appreciate you taking the time to let us know about your experience today. Thank you!   [facility]: [phone]  

## 2021-02-02 NOTE — Progress Notes (Addendum)
Chief Complaint  Patient presents with   Gynecologic Exam    No concerns    HPI:      [name] is a 55 y.o. G2P1010 whose LMP was Patient's last menstrual period was 11/06/2014., presents today for her annual examination.  Her menses are absent. She does not have PMB. She does have occas tolerable vasomotor sx.   Sex activity: single partner, contraception - post menopausal status. She does have vaginal dryness occas. Has not tried lubricants.  Last Pap: 01/29/19  Results were: no abnormalities /neg HPV DNA.  Hx of STDs: none  Last mammogram: 02/26/20 at [facility], Results were: normal--routine follow-up in 12 months There is a FH of breast cancer in her pat cousin. There is a FH of ovarian cancer in her cousin and possibly mat aunt. Vistaseq testing was neg 2018. The patient does do self-breast exams.  Colonoscopy: colonoscopy 10/19 with polyps, hx of polyps on prior colonoscopy too. Repeat due after 5 yrs per [facility] (pt confirmed with them). Done by Dr. Vira Agar.   Tobacco use: The patient denies current or previous tobacco use. Alcohol use: none  Drug use: none Exercise: moderately active  She does get adequate calcium and Vitamin D in her diet.  She has HTN and is doing well on lisinopril/HCTZ. She has type 2 DM, HgA1C 8/22 was 5.9%, recheck due 2/23. Taking metformin 500 mg QD. She gets eye exams yearly. Lipids good on pravastatin 20 mg daily 5/22. No side effects with meds. Repeat labs due 2/23. Rx RF for a yr 8/22 on lisinopril/HCTZ and pravastatin. Doing wt watchers for wt loss.   Needs RF on xyzal for allergies and meloxicam for joint pain. Has Baker's cyst LT knee that is bothersome. Doesn't exercise much but plans to try to do more, considering water aerobics.    Past Medical History:  Diagnosis Date   Abnormal mammogram 2012   resolved   Arthritis    Complication of anesthesia    Diabetes mellitus without complication ([place])    Family history of adverse reaction to  anesthesia    mom-hard to wake up, n/v   Family history of ovarian cancer    Fundic gland polyps of stomach, benign    Glaucoma    History of mammogram 01/27/2016   Birads 1   History of Papanicolaou smear of cervix 01/17/2016   NIL/NEG   Hypertension    IBS (irritable bowel syndrome)    Mixed hyperlipidemia    Osteoarthritis    PONV (postoperative nausea and vomiting)    n/v   Rhinitis, allergic    Sleep apnea    does not use CPAP   Testing of female for genetic disease carrier status 02/2016   VISTASEQ Neg   Torn meniscus    RIGHT KNEE-PT STATES IT HAS HEALED BUT STILL HAS WEAKNESS AND PAIN IN KNEE   Type 2 diabetes mellitus ([place])     Past Surgical History:  Procedure Laterality Date   COLONOSCOPY  07/2009;2014   benign polyps neg   COLONOSCOPY WITH PROPOFOL N/A 03/11/2018   Procedure: COLONOSCOPY WITH PROPOFOL;  Surgeon: Manya Silvas, MD;  Location: [facility] ENDOSCOPY;  Service: Endoscopy;  Laterality: N/A;   DILATION AND CURETTAGE OF UTERUS     ESOPHAGOGASTRODUODENOSCOPY N/A 11/06/2014   Procedure: ESOPHAGOGASTRODUODENOSCOPY (EGD);  Surgeon: Josefine Class, MD;  Location: [facility] ENDOSCOPY;  Service: Endoscopy;  Laterality: N/A;  With Small Bowel Biopsies   GANGLION CYST EXCISION Left  Family History  Problem Relation Age of Onset   Hypertension Mother    Diabetes Father    Heart disease Father    Hypertension Father    Breast cancer Cousin 21   Ovarian cancer Maternal Aunt 70   Ovarian cancer Cousin 12    Social History   Socioeconomic History   Marital status: Married    Spouse name: Not on file   Number of children: Not on file   Years of education: Not on file   Highest education level: Not on file  Occupational History   Not on file  Tobacco Use   Smoking status: Never   Smokeless tobacco: Never  Vaping Use   Vaping Use: Never used  Substance and Sexual Activity   Alcohol use: Yes   Drug use: No   Sexual activity: Yes    Birth  control/protection: Post-menopausal  Other Topics Concern   Not on file  Social History Narrative   Not on file   Social Determinants of Health   Financial Resource Strain: Not on file  Food Insecurity: Not on file  Transportation Needs: Not on file  Physical Activity: Not on file  Stress: Not on file  Social Connections: Not on file  Intimate Partner Violence: Not on file     Current Outpatient Medications:    Cholecalciferol 50 MCG (2000 UT) CAPS, Vitamin D3 50 mcg (2,000 unit) capsule, Disp: , Rfl:    fluticasone (FLONASE) 50 MCG/ACT nasal spray, USE 2 SPRAYS IN EACH  NOSTRIL DAILY AS NEEDED FOR ALLERGIES OR RHINITIS, Disp: 48 g, Rfl: 0   glucose blood test strip, OneTouch Verio strips, Disp: , Rfl:    lisinopril-hydrochlorothiazide (ZESTORETIC) 20-12.5 MG tablet, Take 1 tablet by mouth daily., Disp: 90 tablet, Rfl: 3   metFORMIN (GLUCOPHAGE) 500 MG tablet, Take 1 tablet (500 mg total) by mouth every evening., Disp: 90 tablet, Rfl: 1   ONETOUCH DELICA LANCETS [id number] MISC, OneTouch Delica Lancets 33 gauge, Disp: , Rfl:    pravastatin (PRAVACHOL) 20 MG tablet, Take 1 tablet (20 mg total) by mouth daily., Disp: 90 tablet, Rfl: 3   tazarotene (AVAGE) 0.1 % cream, APPLY TOPICALLY TO ELBOWS  AT BEDTIME FOR PSORIASIS, Disp: 30 g, Rfl: 0   triamcinolone cream (KENALOG) 0.1 %, APPLY ON THE SKIN AS DIRECTED TO AFFECTED ELBOWS 1-2 TIMES A DAY AS NEEDED AVOID FACE/GROIN/AXILLA., Disp: 80 g, Rfl: 2   levocetirizine (XYZAL) 5 MG tablet, Take 1 tablet (5 mg total) by mouth every evening., Disp: 90 tablet, Rfl: 3   meloxicam (MOBIC) 15 MG tablet, TAKE 1 TABLET BY MOUTH ONCE A DAY AS NEEDED FOR PAIN, Disp: 90 tablet, Rfl: 1   ROS:  Review of Systems  Constitutional:  Negative for fatigue, fever and unexpected weight change.  Respiratory:  Negative for cough, shortness of breath and wheezing.   Cardiovascular:  Negative for chest pain, palpitations and leg swelling.  Gastrointestinal:  Negative  for abdominal pain, blood in stool, constipation, diarrhea, nausea and vomiting.  Endocrine: Negative for cold intolerance, heat intolerance and polyuria.  Genitourinary:  Negative for dyspareunia, dysuria, flank pain, frequency, genital sores, hematuria, menstrual problem, pelvic pain, urgency, vaginal bleeding, vaginal discharge and vaginal pain.  Musculoskeletal:  Positive for arthralgias and joint swelling. Negative for back pain and myalgias.  Skin:  Negative for rash.  Neurological:  Negative for dizziness, syncope, light-headedness, numbness and headaches.  Hematological:  Negative for adenopathy.  Psychiatric/Behavioral:  Negative for agitation, confusion, sleep disturbance and suicidal  ideas. The patient is not nervous/anxious.    Objective: BP 130/70   Ht '5\' 1"'$  (1.549 m)   Wt 175 lb (79.4 kg)   LMP 11/06/2014   BMI 33.07 kg/m    Physical Exam Constitutional:      Appearance: She is well-developed.  Genitourinary:     Vulva normal.     Right Labia: No rash, tenderness or lesions.    Left Labia: No tenderness, lesions or rash.    No vaginal discharge, erythema or tenderness.      Right Adnexa: not tender and no mass present.    Left Adnexa: not tender and no mass present.    No cervical motion tenderness, friability or polyp.     Uterus is not enlarged or tender.  Breasts:    Right: No mass, nipple discharge, skin change or tenderness.     Left: No mass, nipple discharge, skin change or tenderness.  Neck:     Thyroid: No thyromegaly.  Cardiovascular:     Rate and Rhythm: Normal rate and regular rhythm.     Heart sounds: Normal heart sounds. No murmur heard. Pulmonary:     Effort: Pulmonary effort is normal.     Breath sounds: Normal breath sounds.  Abdominal:     Palpations: Abdomen is soft.     Tenderness: There is no abdominal tenderness. There is no guarding or rebound.  Musculoskeletal:        General: Normal range of motion.     Cervical back: Normal range  of motion.  Lymphadenopathy:     Cervical: No cervical adenopathy.  Neurological:     General: No focal deficit present.     Mental Status: She is alert and oriented to person, place, and time.     Cranial Nerves: No cranial nerve deficit.  Skin:    General: Skin is warm and dry.  Psychiatric:        Mood and Affect: Mood normal.        Behavior: Behavior normal.        Thought Content: Thought content normal.        Judgment: Judgment normal.  Vitals reviewed.    Assessment/Plan:  Encounter for annual routine gynecological examination  Encounter for screening mammogram for malignant neoplasm of breast - Plan: MM 3D SCREEN BREAST BILATERAL; pt to sched mammo  Type 2 diabetes mellitus without complication, without long-term current use of insulin ([place]) - Plan: Hemoglobin A1c, Comprehensive metabolic panel; Rx RF [id number], labs due 2/23. Will RF based on results. Cont wt watchers, increase exercise.   Essential hypertension - Controlled with meds. Rx RF eRxd to optum 8/22. REchk in 1 yr - Plan: Comprehensive metabolic panel, lisinopril-hydrochlorothiazide (ZESTORETIC) 20-12.5 MG tablet  Mixed hyperlipidemia - Diet/exercise/wt loss - Plan: Lipid panel; normal levels. Rx RF sent for 1 yr 8/22, recheck labs 1 yr.   Arthralgia of both knees - Plan: meloxicam (MOBIC) 15 MG tablet; Rx RF. Increase exercise, can do PT prn.   Allergic rhinitis, unspecified seasonality, unspecified trigger - Plan: levocetirizine (XYZAL) 5 MG tablet; Rx RF.    Meds ordered this encounter  Medications   levocetirizine (XYZAL) 5 MG tablet    Sig: Take 1 tablet (5 mg total) by mouth every evening.    Dispense:  90 tablet    Refill:  3    Order Specific Question:   Supervising Provider    Answer:   Gae Dry [id number]   meloxicam (MOBIC) 15 MG tablet  Sig: TAKE 1 TABLET BY MOUTH ONCE A DAY AS NEEDED FOR PAIN    Dispense:  90 tablet    Refill:  1    Order Specific Question:   Supervising Provider     Answer:   Gae Dry [id number]          GYN counsel mammography screening, adequate intake of calcium and vitamin D, diet and exercise    F/U  Return in about 1 year (around 02/02/2022).  Hailey Stormer B. Tarra Pence, PA-C 02/02/2021 2:47 PM

## 2021-02-24 ENCOUNTER — Other Ambulatory Visit: Payer: Self-pay

## 2021-02-24 ENCOUNTER — Ambulatory Visit: Payer: No Typology Code available for payment source | Admitting: Dermatology

## 2021-02-24 ENCOUNTER — Encounter: Payer: Self-pay | Admitting: Dermatology

## 2021-02-24 DIAGNOSIS — Z1283 Encounter for screening for malignant neoplasm of skin: Secondary | ICD-10-CM

## 2021-02-24 DIAGNOSIS — L814 Other melanin hyperpigmentation: Secondary | ICD-10-CM

## 2021-02-24 DIAGNOSIS — D229 Melanocytic nevi, unspecified: Secondary | ICD-10-CM

## 2021-02-24 DIAGNOSIS — L821 Other seborrheic keratosis: Secondary | ICD-10-CM

## 2021-02-24 DIAGNOSIS — Z86018 Personal history of other benign neoplasm: Secondary | ICD-10-CM | POA: Diagnosis not present

## 2021-02-24 DIAGNOSIS — R21 Rash and other nonspecific skin eruption: Secondary | ICD-10-CM

## 2021-02-24 DIAGNOSIS — D1800 Hemangioma unspecified site: Secondary | ICD-10-CM

## 2021-02-24 DIAGNOSIS — Q858 Other phakomatoses, not elsewhere classified: Secondary | ICD-10-CM | POA: Diagnosis not present

## 2021-02-24 DIAGNOSIS — L409 Psoriasis, unspecified: Secondary | ICD-10-CM

## 2021-02-24 DIAGNOSIS — L578 Other skin changes due to chronic exposure to nonionizing radiation: Secondary | ICD-10-CM

## 2021-02-24 MED ORDER — TAZAROTENE 0.1 % EX CREA: TOPICAL_CREAM | Freq: Every day | CUTANEOUS | 11 refills | Status: DC

## 2021-02-24 NOTE — Patient Instructions (Signed)

## 2021-02-24 NOTE — Progress Notes (Signed)
Follow-Up Visit   Subjective  Teresa Moran is a 55 y.o. female who presents for the following: Annual Exam (History of dysplastic nevus - TBSE today). The patient presents for Total-Body Skin Exam (TBSE) for skin cancer screening and mole check.  The following portions of the chart were reviewed this encounter and updated as appropriate:   Tobacco  Allergies  Meds  Problems  Med Hx  Surg Hx  Fam Hx     Review of Systems:  No other skin or systemic complaints except as noted in HPI or Assessment and Plan.  Objective  Well appearing patient in no apparent distress; mood and affect are within normal limits.  A full examination was performed including scalp, head, eyes, ears, nose, lips, neck, chest, axillae, abdomen, back, buttocks, bilateral upper extremities, bilateral lower extremities, hands, feet, fingers, toes, fingernails, and toenails. All findings within normal limits unless otherwise noted below.  Mid Lower [place] Lip Brown macules   Assessment & Plan   History of Dysplastic Nevi - No evidence of recurrence today - Recommend regular full body skin exams - Recommend daily broad spectrum sunscreen SPF 30+ to sun-exposed areas, reapply every 2 hours as needed.  - Call if any new or changing lesions are noted between office visits  Lentigines - Scattered tan macules - Due to sun exposure - Benign-appearing, observe - Recommend daily broad spectrum sunscreen SPF 30+ to sun-exposed areas, reapply every 2 hours as needed. - Call for any changes  Seborrheic Keratoses - Stuck-on, waxy, tan-brown papules and/or plaques  - Benign-appearing - Discussed benign etiology and prognosis. - Observe - Call for any changes  Melanocytic Nevi - Tan-brown and/or pink-flesh-colored symmetric macules and papules - Benign appearing on exam today - Observation - Call clinic for new or changing moles - Recommend daily use of broad spectrum spf 30+ sunscreen to sun-exposed  areas.   Hemangiomas - Red papules - Discussed benign nature - Observe - Call for any changes  Actinic Damage - Chronic condition, secondary to cumulative UV/sun exposure - diffuse scaly erythematous macules with underlying dyspigmentation - Recommend daily broad spectrum sunscreen SPF 30+ to sun-exposed areas, reapply every 2 hours as needed.  - Staying in the shade or wearing long sleeves, sun glasses (UVA+UVB protection) and wide brim hats (4-inch brim around the entire circumference of the hat) are also recommended for sun protection.  - Call for new or changing lesions.  Skin cancer screening performed today.  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  There is positive family history of this condition and the patient's mother with freckling of the lips.  This is a rare autosomal dominant condition characterized by mucocutaneous pigmentation and multiple intestinal hamartomatous polyps.  The Genetic defect is due to mutations in the serine/threonine kinase 11 (STK 11) gene. There is higher risk of colorectal and breast cancer as well as other cancers but this condition. The patient currently gets regular checks by her primary care doctor and gets regular colonoscopies for evaluation. 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 Patient has had regular colonoscopies with no findings. Will review information on condition and will get in touch with patient if she needs any further testing.  Psoriasis Elbows tazarotene (TAZORAC) 0.1 % cream - Elbows Apply topically at bedtime. Psoriasis is a chronic non-curable, but treatable genetic/hereditary disease that may have other systemic features affecting other organ systems  such as joints (Psoriatic Arthritis). It is associated with an increased risk of inflammatory bowel disease, heart disease, non-alcoholic fatty liver  disease, and depression.    Skin cancer screening  Return in about 1 year (around 02/24/2022) for TBSE.  I, Ashok Cordia, CMA, am acting as scribe for Sarina Ser, MD . Documentation: I have reviewed the above documentation for accuracy and completeness, and I agree with the above.  Sarina Ser, MD

## 2021-03-01 ENCOUNTER — Encounter: Payer: Self-pay | Admitting: Obstetrics and Gynecology

## 2021-03-01 ENCOUNTER — Encounter: Payer: Self-pay | Admitting: Dermatology

## 2021-03-02 ENCOUNTER — Encounter: Payer: Self-pay | Admitting: Obstetrics and Gynecology

## 2021-03-09 ENCOUNTER — Encounter: Payer: Self-pay | Admitting: Obstetrics and Gynecology

## 2021-03-09 DIAGNOSIS — Z1159 Encounter for screening for other viral diseases: Secondary | ICD-10-CM

## 2021-03-25 ENCOUNTER — Other Ambulatory Visit: Payer: No Typology Code available for payment source

## 2021-04-01 ENCOUNTER — Other Ambulatory Visit: Payer: No Typology Code available for payment source

## 2021-04-01 ENCOUNTER — Other Ambulatory Visit: Payer: Self-pay

## 2021-04-01 DIAGNOSIS — Z1159 Encounter for screening for other viral diseases: Secondary | ICD-10-CM

## 2021-04-01 DIAGNOSIS — E119 Type 2 diabetes mellitus without complications: Secondary | ICD-10-CM

## 2021-04-02 LAB — COMPREHENSIVE METABOLIC PANEL WITH GFR
ALT: 13 [IU]/L (ref 0–32)
AST: 12 [IU]/L (ref 0–40)
Albumin/Globulin Ratio: 2 (ref 1.2–2.2)
Albumin: 4 g/dL (ref 3.8–4.9)
Alkaline Phosphatase: 65 [IU]/L (ref 44–121)
BUN/Creatinine Ratio: 12 (ref 9–23)
BUN: 11 mg/dL (ref 6–24)
Bilirubin Total: 0.3 mg/dL (ref 0.0–1.2)
CO2: 26 mmol/L (ref 20–29)
Calcium: 9.2 mg/dL (ref 8.7–10.2)
Chloride: 103 mmol/L (ref 96–106)
Creatinine, Ser: 0.93 mg/dL (ref 0.57–1.00)
Globulin, Total: 2 g/dL (ref 1.5–4.5)
Glucose: 153 mg/dL — ABNORMAL HIGH (ref 70–99)
Potassium: 4 mmol/L (ref 3.5–5.2)
Sodium: 142 mmol/L (ref 134–144)
Total Protein: 6 g/dL (ref 6.0–8.5)
eGFR: 72 mL/min/{1.73_m2}

## 2021-04-02 LAB — HEMOGLOBIN A1C
Est. average glucose Bld gHb Est-mCnc: 120 mg/dL
Hgb A1c MFr Bld: 5.8 % — ABNORMAL HIGH (ref 4.8–5.6)

## 2021-04-02 LAB — HEPATITIS B SURFACE ANTIGEN: Hepatitis B Surface Ag: NEGATIVE

## 2021-06-09 ENCOUNTER — Other Ambulatory Visit: Payer: Self-pay | Admitting: Obstetrics and Gynecology

## 2021-06-09 DIAGNOSIS — E119 Type 2 diabetes mellitus without complications: Secondary | ICD-10-CM

## 2021-06-13 ENCOUNTER — Encounter: Payer: Self-pay | Admitting: Obstetrics and Gynecology

## 2021-06-16 MED ORDER — AZITHROMYCIN 500 MG PO TABS: 500.0000 mg | ORAL_TABLET | Freq: Every day | ORAL | 0 refills | Status: AC

## 2021-08-05 ENCOUNTER — Encounter: Payer: Self-pay | Admitting: Obstetrics and Gynecology

## 2021-08-08 ENCOUNTER — Other Ambulatory Visit: Payer: Self-pay | Admitting: Obstetrics and Gynecology

## 2021-08-08 MED ORDER — BENZONATATE 200 MG PO CAPS: 200.0000 mg | ORAL_CAPSULE | Freq: Three times a day (TID) | ORAL | 0 refills | Status: DC | PRN

## 2021-08-08 NOTE — Progress Notes (Signed)
Rx to [facility] not [facility].  ?

## 2021-10-10 ENCOUNTER — Telehealth: Payer: Self-pay

## 2021-10-10 NOTE — Telephone Encounter (Signed)
Patient aware.

## 2021-10-10 NOTE — Telephone Encounter (Signed)
Pt calling triage today wanting [facility] wanted to get any labs before annual appt scheduled in august. Let pt know it says in last note that we will check labs again in a year. So in august for annual, we will do labs. LM with pt ?

## 2021-11-07 ENCOUNTER — Ambulatory Visit (LOCAL_COMMUNITY_HEALTH_CENTER): Payer: No Typology Code available for payment source

## 2021-11-07 DIAGNOSIS — Z23 Encounter for immunization: Secondary | ICD-10-CM

## 2021-11-07 DIAGNOSIS — Z719 Counseling, unspecified: Secondary | ICD-10-CM

## 2021-11-07 NOTE — Progress Notes (Signed)
Presents to nurse clinic requesting 2nd hepatitis A vaccine.  First dose given 04/11/21.  Hepatitis A ([place]) administered IM left deltoid; tolerated well.  VIS and updated [facility] record given to pt.  Tonny Branch, RN

## 2021-11-23 ENCOUNTER — Other Ambulatory Visit: Payer: Self-pay | Admitting: Obstetrics and Gynecology

## 2021-11-23 DIAGNOSIS — I10 Essential (primary) hypertension: Secondary | ICD-10-CM

## 2021-11-23 DIAGNOSIS — E119 Type 2 diabetes mellitus without complications: Secondary | ICD-10-CM

## 2021-11-25 ENCOUNTER — Encounter: Payer: Self-pay | Admitting: Obstetrics and Gynecology

## 2021-11-28 ENCOUNTER — Telehealth: Payer: Self-pay | Admitting: Obstetrics and Gynecology

## 2021-11-28 DIAGNOSIS — E119 Type 2 diabetes mellitus without complications: Secondary | ICD-10-CM

## 2021-11-28 DIAGNOSIS — Z13220 Encounter for screening for lipoid disorders: Secondary | ICD-10-CM

## 2021-11-28 DIAGNOSIS — Z0000 Encounter for general adult medical examination without abnormal findings: Secondary | ICD-10-CM

## 2021-12-13 ENCOUNTER — Encounter: Payer: Self-pay | Admitting: Obstetrics and Gynecology

## 2021-12-13 NOTE — Telephone Encounter (Signed)
Patient is schedule for 12/14/21 with Labs

## 2021-12-15 ENCOUNTER — Other Ambulatory Visit: Payer: No Typology Code available for payment source

## 2021-12-15 DIAGNOSIS — Z13220 Encounter for screening for lipoid disorders: Secondary | ICD-10-CM

## 2021-12-15 DIAGNOSIS — E119 Type 2 diabetes mellitus without complications: Secondary | ICD-10-CM

## 2021-12-15 DIAGNOSIS — Z0000 Encounter for general adult medical examination without abnormal findings: Secondary | ICD-10-CM

## 2021-12-16 ENCOUNTER — Other Ambulatory Visit: Payer: No Typology Code available for payment source

## 2021-12-16 ENCOUNTER — Other Ambulatory Visit: Payer: Self-pay | Admitting: Obstetrics and Gynecology

## 2021-12-16 DIAGNOSIS — E119 Type 2 diabetes mellitus without complications: Secondary | ICD-10-CM

## 2021-12-16 LAB — LIPID PANEL
Chol/HDL Ratio: 3.7 ratio (ref 0.0–4.4)
Cholesterol, Total: 195 mg/dL (ref 100–199)
HDL: 53 mg/dL (ref >39)
LDL Chol Calc (NIH): 125 mg/dL — ABNORMAL HIGH (ref 0–99)
Triglycerides: 95 mg/dL (ref 0–149)
VLDL Cholesterol Cal: 17 mg/dL (ref 5–40)

## 2021-12-16 LAB — COMPREHENSIVE METABOLIC PANEL
ALT: 14 IU/L (ref 0–32)
AST: 14 IU/L (ref 0–40)
Albumin/Globulin Ratio: 2.6 — ABNORMAL HIGH (ref 1.2–2.2)
Albumin: 4.4 g/dL (ref 3.8–4.9)
Alkaline Phosphatase: 57 IU/L (ref 44–121)
BUN/Creatinine Ratio: 11 (ref 9–23)
BUN: 10 mg/dL (ref 6–24)
Bilirubin Total: 0.6 mg/dL (ref 0.0–1.2)
CO2: 25 mmol/L (ref 20–29)
Calcium: 9.6 mg/dL (ref 8.7–10.2)
Chloride: 103 mmol/L (ref 96–106)
Creatinine, Ser: 0.94 mg/dL (ref 0.57–1.00)
Globulin, Total: 1.7 g/dL (ref 1.5–4.5)
Glucose: 132 mg/dL — ABNORMAL HIGH (ref 70–99)
Potassium: 3.8 mmol/L (ref 3.5–5.2)
Sodium: 143 mmol/L (ref 134–144)
Total Protein: 6.1 g/dL (ref 6.0–8.5)
eGFR: 71 mL/min/{1.73_m2} (ref >59)

## 2021-12-16 LAB — HEMOGLOBIN A1C
Est. average glucose Bld gHb Est-mCnc: 128 mg/dL
Hgb A1c MFr Bld: 6.1 % — ABNORMAL HIGH (ref 4.8–5.6)

## 2021-12-16 MED ORDER — METFORMIN HCL 500 MG PO TABS: 500.0000 mg | ORAL_TABLET | Freq: Every evening | ORAL | 0 refills | Status: DC

## 2021-12-16 NOTE — Telephone Encounter (Signed)
Contacted pt about form that needed to be filled out because the one pt provided is not the one we need to fill out. Pt said to disregard form she sent and that she was confused herself. This year is different so the usual form not needed.

## 2021-12-16 NOTE — Progress Notes (Signed)
Rx RF metformin till annual. Recent hgA1C was 6.1.

## 2022-01-16 ENCOUNTER — Other Ambulatory Visit: Payer: No Typology Code available for payment source

## 2022-02-07 ENCOUNTER — Ambulatory Visit (INDEPENDENT_AMBULATORY_CARE_PROVIDER_SITE_OTHER): Payer: No Typology Code available for payment source | Admitting: Obstetrics and Gynecology

## 2022-02-07 ENCOUNTER — Encounter: Payer: Self-pay | Admitting: Obstetrics and Gynecology

## 2022-02-07 VITALS — BP 120/70 | Ht 61.0 in | Wt 176.0 lb

## 2022-02-07 DIAGNOSIS — M25562 Pain in left knee: Secondary | ICD-10-CM

## 2022-02-07 DIAGNOSIS — I10 Essential (primary) hypertension: Secondary | ICD-10-CM

## 2022-02-07 DIAGNOSIS — Z1151 Encounter for screening for human papillomavirus (HPV): Secondary | ICD-10-CM | POA: Diagnosis not present

## 2022-02-07 DIAGNOSIS — Z124 Encounter for screening for malignant neoplasm of cervix: Secondary | ICD-10-CM | POA: Diagnosis not present

## 2022-02-07 DIAGNOSIS — Z1231 Encounter for screening mammogram for malignant neoplasm of breast: Secondary | ICD-10-CM | POA: Diagnosis not present

## 2022-02-07 DIAGNOSIS — E119 Type 2 diabetes mellitus without complications: Secondary | ICD-10-CM

## 2022-02-07 DIAGNOSIS — Z01419 Encounter for gynecological examination (general) (routine) without abnormal findings: Secondary | ICD-10-CM | POA: Diagnosis not present

## 2022-02-07 DIAGNOSIS — J309 Allergic rhinitis, unspecified: Secondary | ICD-10-CM

## 2022-02-07 DIAGNOSIS — M25561 Pain in right knee: Secondary | ICD-10-CM

## 2022-02-07 MED ORDER — METFORMIN HCL 500 MG PO TABS: 500.0000 mg | ORAL_TABLET | Freq: Every evening | ORAL | 0 refills | Status: DC

## 2022-02-07 MED ORDER — LEVOCETIRIZINE DIHYDROCHLORIDE 5 MG PO TABS: 5.0000 mg | ORAL_TABLET | Freq: Every evening | ORAL | 3 refills | Status: DC

## 2022-02-07 MED ORDER — PRAVASTATIN SODIUM 20 MG PO TABS: 20.0000 mg | ORAL_TABLET | Freq: Every day | ORAL | 3 refills | Status: DC

## 2022-02-07 MED ORDER — FLUTICASONE PROPIONATE 50 MCG/ACT NA SUSP: NASAL | 1 refills | Status: DC

## 2022-02-07 MED ORDER — LISINOPRIL-HYDROCHLOROTHIAZIDE 20-12.5 MG PO TABS: 1.0000 | ORAL_TABLET | Freq: Every day | ORAL | 3 refills | Status: DC

## 2022-02-07 NOTE — Patient Instructions (Signed)
I value your feedback and you entrusting us with your care. If you get a  patient survey, I would appreciate you taking the time to let us know about your experience today. Thank you! ? ? ?

## 2022-02-07 NOTE — Progress Notes (Signed)
Chief Complaint  Patient presents with   Gynecologic Exam    No concerns    HPI:      [name] is a 56 y.o. G2P1010 whose LMP was Patient's last menstrual period was 11/06/2014., presents today for her annual examination.  Her menses are absent due to menopause. She does not have PMB. She does have occas tolerable vasomotor sx.   Sex activity: single partner, contraception - post menopausal status. She does not have vaginal dryness occas.   Last Pap: 01/29/19  Results were: no abnormalities /neg HPV DNA. Hx of STDs: none  Last mammogram: 03/02/21 at [facility], Results were: normal--routine follow-up in 12 months; has appt already There is a FH of breast cancer in her pat cousin. There is a FH of ovarian cancer in her cousin and possibly mat aunt. Vistaseq testing was neg 2018. The patient does occas do self-breast exams.  Colonoscopy: colonoscopy 10/19 with polyps, hx of polyps on prior colonoscopy too. Repeat due after 5 yrs per [facility] (pt confirmed with them). Done by Dr. Vira Agar.   Tobacco use: The patient denies current or previous tobacco use. Alcohol use: none  Drug use: none Exercise: moderately active  She does get adequate calcium and Vitamin D in her diet.  She has HTN and is doing well on lisinopril/HCTZ. She has type 2 DM, HgA1C 7/23 was 6.1%, recheck due 2/24. Taking metformin 500 mg QD. She gets eye exams yearly. Lipids good on pravastatin 20 mg daily 7/23. No side effects with meds. Repeat DM labs due 2/24.   Needs RF on xyzal and flonase prn for allergies. Knee pain improved with gel and steroid injections.     Past Medical History:  Diagnosis Date   Abnormal mammogram 2012   resolved   Arthritis    Atypical mole 02/2020   right mid back paraspinal   Complication of anesthesia    Diabetes mellitus without complication ([place])    Family history of adverse reaction to anesthesia    mom-hard to wake up, n/v   Family history of ovarian cancer    Fundic gland  polyps of stomach, benign    Glaucoma    History of mammogram 01/27/2016   Birads 1   History of Papanicolaou smear of cervix 01/17/2016   NIL/NEG   Hypertension    IBS (irritable bowel syndrome)    Mixed hyperlipidemia    Osteoarthritis    PONV (postoperative nausea and vomiting)    n/v   Rhinitis, allergic    Sleep apnea    does not use CPAP   Testing of female for genetic disease carrier status 02/2016   VISTASEQ Neg   Torn meniscus    RIGHT KNEE-PT STATES IT HAS HEALED BUT STILL HAS WEAKNESS AND PAIN IN KNEE   Type 2 diabetes mellitus ([place])     Past Surgical History:  Procedure Laterality Date   COLONOSCOPY  07/2009;2014   benign polyps neg   COLONOSCOPY WITH PROPOFOL N/A 03/11/2018   repeat after 5 yrs due to polyp; Procedure: COLONOSCOPY WITH PROPOFOL;  Surgeon: Manya Silvas, MD;  Location: [facility] ENDOSCOPY;  Service: Endoscopy;  Laterality: N/A;   DILATION AND CURETTAGE OF UTERUS     ESOPHAGOGASTRODUODENOSCOPY N/A 11/06/2014   Procedure: ESOPHAGOGASTRODUODENOSCOPY (EGD);  Surgeon: Josefine Class, MD;  Location: [facility] ENDOSCOPY;  Service: Endoscopy;  Laterality: N/A;  With Small Bowel Biopsies   GANGLION CYST EXCISION Left     Family History  Problem Relation Age of  Onset   Hypertension Mother    Diabetes Father    Heart disease Father    Hypertension Father    Breast cancer Cousin 50   Ovarian cancer Maternal Aunt 85   Ovarian cancer Cousin 81    Social History   Socioeconomic History   Marital status: Married    Spouse name: Not on file   Number of children: Not on file   Years of education: Not on file   Highest education level: Not on file  Occupational History   Not on file  Tobacco Use   Smoking status: Never   Smokeless tobacco: Never  Vaping Use   Vaping Use: Never used  Substance and Sexual Activity   Alcohol use: Yes   Drug use: No   Sexual activity: Yes    Birth control/protection: Post-menopausal  Other Topics Concern   Not on  file  Social History Narrative   Not on file   Social Determinants of Health   Financial Resource Strain: Not on file  Food Insecurity: Not on file  Transportation Needs: Not on file  Physical Activity: Not on file  Stress: Not on file  Social Connections: Not on file  Intimate Partner Violence: Not on file     Current Outpatient Medications:    Cholecalciferol 50 MCG (2000 UT) CAPS, Vitamin D3 50 mcg (2,000 unit) capsule, Disp: , Rfl:    gabapentin (NEURONTIN) 400 MG capsule, gabapentin 400 mg capsule  TAKE 1 CAPSULE BY MOUTH TWICE A DAY AS NEEDED, Disp: , Rfl:    glucose blood test strip, OneTouch Verio strips, Disp: , Rfl:    meloxicam (MOBIC) 15 MG tablet, TAKE 1 TABLET BY MOUTH ONCE A DAY AS NEEDED FOR PAIN, Disp: 90 tablet, Rfl: 1   Misc Natural Products (CANDICIDAL) CAPS, Take by mouth., Disp: , Rfl:    ONETOUCH DELICA LANCETS 52D MISC, OneTouch Delica Lancets 33 gauge, Disp: , Rfl:    tazarotene (TAZORAC) 0.1 % cream, Apply topically at bedtime., Disp: 30 g, Rfl: 11   triamcinolone cream (KENALOG) 0.1 %, , Disp: , Rfl:    fluticasone (FLONASE) 50 MCG/ACT nasal spray, USE 2 SPRAYS IN EACH  NOSTRIL DAILY AS NEEDED FOR ALLERGIES OR RHINITIS, Disp: 48 g, Rfl: 1   levocetirizine (XYZAL) 5 MG tablet, Take 1 tablet (5 mg total) by mouth every evening., Disp: 90 tablet, Rfl: 3   lisinopril-hydrochlorothiazide (ZESTORETIC) 20-12.5 MG tablet, Take 1 tablet by mouth daily., Disp: 90 tablet, Rfl: 3   metFORMIN (GLUCOPHAGE) 500 MG tablet, Take 1 tablet (500 mg total) by mouth every evening., Disp: 90 tablet, Rfl: 0   pravastatin (PRAVACHOL) 20 MG tablet, Take 1 tablet (20 mg total) by mouth daily., Disp: 90 tablet, Rfl: 3   ROS:  Review of Systems  Constitutional:  Negative for fatigue, fever and unexpected weight change.  Respiratory:  Negative for cough, shortness of breath and wheezing.   Cardiovascular:  Negative for chest pain, palpitations and leg swelling.  Gastrointestinal:   Negative for abdominal pain, blood in stool, constipation, diarrhea, nausea and vomiting.  Endocrine: Negative for cold intolerance, heat intolerance and polyuria.  Genitourinary:  Negative for dyspareunia, dysuria, flank pain, frequency, genital sores, hematuria, menstrual problem, pelvic pain, urgency, vaginal bleeding, vaginal discharge and vaginal pain.  Musculoskeletal:  Positive for arthralgias and joint swelling. Negative for back pain and myalgias.  Skin:  Negative for rash.  Neurological:  Positive for headaches. Negative for dizziness, syncope, light-headedness and numbness.  Hematological:  Negative  for adenopathy.  Psychiatric/Behavioral:  Negative for agitation, confusion, sleep disturbance and suicidal ideas. The patient is not nervous/anxious.     Objective: BP 120/70   Ht '5\' 1"'$  (1.549 m)   Wt 176 lb (79.8 kg)   LMP 11/06/2014   BMI 33.25 kg/m    Physical Exam Constitutional:      Appearance: She is well-developed.  Genitourinary:     Vulva normal.     Right Labia: No rash, tenderness or lesions.    Left Labia: No tenderness, lesions or rash.    No vaginal discharge, erythema or tenderness.      Right Adnexa: not tender and no mass present.    Left Adnexa: not tender and no mass present.    No cervical motion tenderness, friability or polyp.     Uterus is not enlarged or tender.  Breasts:    Right: No mass, nipple discharge, skin change or tenderness.     Left: No mass, nipple discharge, skin change or tenderness.  Neck:     Thyroid: No thyromegaly.  Cardiovascular:     Rate and Rhythm: Normal rate and regular rhythm.     Heart sounds: Normal heart sounds. No murmur heard. Pulmonary:     Effort: Pulmonary effort is normal.     Breath sounds: Normal breath sounds.  Abdominal:     Palpations: Abdomen is soft.     Tenderness: There is no abdominal tenderness. There is no guarding or rebound.  Musculoskeletal:        General: Normal range of motion.      Cervical back: Normal range of motion.  Lymphadenopathy:     Cervical: No cervical adenopathy.  Neurological:     General: No focal deficit present.     Mental Status: She is alert and oriented to person, place, and time.     Cranial Nerves: No cranial nerve deficit.  Skin:    General: Skin is warm and dry.  Psychiatric:        Mood and Affect: Mood normal.        Behavior: Behavior normal.        Thought Content: Thought content normal.        Judgment: Judgment normal.  Vitals reviewed.     Assessment/Plan:  Encounter for annual routine gynecological examination  Cervical cancer screening - Plan: IGP, Aptima HPV  Screening for HPV (human papillomavirus) - Plan: IGP, Aptima HPV  Encounter for screening mammogram for malignant neoplasm of breast - Plan: MM 3D SCREEN BREAST BILATERAL; pt has appt.   Essential hypertension - Plan: lisinopril-hydrochlorothiazide (ZESTORETIC) 20-12.5 MG tablet; controlled with meds. No side effects. Rx RF to mail order.   Arthralgia of both knees--sx improved with tx. Meloxicam Rx not needed.  Type 2 diabetes mellitus without complication, without long-term current use of insulin (HCC) - Plan: Hemoglobin A1c, pravastatin (PRAVACHOL) 20 MG tablet, metFORMIN (GLUCOPHAGE) 500 MG tablet, lisinopril-hydrochlorothiazide (ZESTORETIC) 20-12.5 MG tablet; cont metformin 500 mg QD. Rx RF till 2/24 labs. Cont yearly eye appt.   Allergic rhinitis, unspecified seasonality, unspecified trigger - Plan: levocetirizine (XYZAL) 5 MG tablet, fluticasone (FLONASE) 50 MCG/ACT nasal spray; Rx RF eRxd to mail order  Mixed hyperlipidemia - Diet/exercise/wt loss - Plan: Lipid panel; normal levels. Rx RF eRxd   Meds ordered this encounter  Medications   pravastatin (PRAVACHOL) 20 MG tablet    Sig: Take 1 tablet (20 mg total) by mouth daily.    Dispense:  90 tablet    Refill:  3    Order Specific Question:   Supervising Provider    Answer:   Rubie Maid [[id number]]    metFORMIN (GLUCOPHAGE) 500 MG tablet    Sig: Take 1 tablet (500 mg total) by mouth every evening.    Dispense:  90 tablet    Refill:  0    Order Specific Question:   Supervising Provider    Answer:   Rubie Maid [[id number]]   lisinopril-hydrochlorothiazide (ZESTORETIC) 20-12.5 MG tablet    Sig: Take 1 tablet by mouth daily.    Dispense:  90 tablet    Refill:  3    Order Specific Question:   Supervising Provider    Answer:   Rubie Maid [[id number]]   levocetirizine (XYZAL) 5 MG tablet    Sig: Take 1 tablet (5 mg total) by mouth every evening.    Dispense:  90 tablet    Refill:  3    Order Specific Question:   Supervising Provider    Answer:   Rubie Maid [[id number]]   fluticasone (FLONASE) 50 MCG/ACT nasal spray    Sig: USE 2 SPRAYS IN EACH  NOSTRIL DAILY AS NEEDED FOR ALLERGIES OR RHINITIS    Dispense:  48 g    Refill:  1    Order Specific Question:   Supervising Provider    Answer:   Renaldo Reel          GYN counsel mammography screening, adequate intake of calcium and vitamin D, diet and exercise    F/U  Return in about 1 year (around 02/08/2023).  Brittiny Levitz B. Keaisha Sublette, PA-C 02/07/2022 4:23 PM

## 2022-02-10 LAB — IGP, APTIMA HPV: HPV Aptima: NEGATIVE

## 2022-03-02 ENCOUNTER — Ambulatory Visit: Payer: No Typology Code available for payment source | Admitting: Dermatology

## 2022-03-02 DIAGNOSIS — L578 Other skin changes due to chronic exposure to nonionizing radiation: Secondary | ICD-10-CM | POA: Diagnosis not present

## 2022-03-02 DIAGNOSIS — L409 Psoriasis, unspecified: Secondary | ICD-10-CM

## 2022-03-02 DIAGNOSIS — Z79899 Other long term (current) drug therapy: Secondary | ICD-10-CM

## 2022-03-02 DIAGNOSIS — L570 Actinic keratosis: Secondary | ICD-10-CM

## 2022-03-02 DIAGNOSIS — L821 Other seborrheic keratosis: Secondary | ICD-10-CM

## 2022-03-02 DIAGNOSIS — L814 Other melanin hyperpigmentation: Secondary | ICD-10-CM

## 2022-03-02 DIAGNOSIS — Z1283 Encounter for screening for malignant neoplasm of skin: Secondary | ICD-10-CM

## 2022-03-02 DIAGNOSIS — D229 Melanocytic nevi, unspecified: Secondary | ICD-10-CM

## 2022-03-02 DIAGNOSIS — L820 Inflamed seborrheic keratosis: Secondary | ICD-10-CM | POA: Diagnosis not present

## 2022-03-02 DIAGNOSIS — Z86018 Personal history of other benign neoplasm: Secondary | ICD-10-CM

## 2022-03-02 DIAGNOSIS — D1801 Hemangioma of skin and subcutaneous tissue: Secondary | ICD-10-CM

## 2022-03-02 MED ORDER — TAZAROTENE 0.1 % EX CREA: TOPICAL_CREAM | Freq: Every day | CUTANEOUS | 11 refills | Status: DC

## 2022-03-02 NOTE — Patient Instructions (Signed)
Cryotherapy Aftercare  Wash gently with soap and water everyday.   Apply Vaseline and Band-Aid daily until healed.     Due to recent changes in healthcare laws, you may see results of your pathology and/or laboratory studies on MyChart before the doctors have had a chance to review them. We understand that in some cases there may be results that are confusing or concerning to you. Please understand that not all results are received at the same time and often the doctors may need to interpret multiple results in order to provide you with the best plan of care or course of treatment. Therefore, we ask that you please give us 2 business days to thoroughly review all your results before contacting the office for clarification. Should we see a critical lab result, you will be contacted sooner.   If You Need Anything After Your Visit  If you have any questions or concerns for your doctor, please call our main line at [phone] and press option 4 to reach your doctor's medical assistant. If no one answers, please leave a voicemail as directed and we will return your call as soon as possible. Messages left after 4 pm will be answered the following business day.   You may also send us a message via MyChart. We typically respond to MyChart messages within 1-2 business days.  For prescription refills, please ask your pharmacy to contact our office. Our fax number is [phone].  If you have an urgent issue when the clinic is closed that cannot wait until the next business day, you can page your doctor at the number below.    Please note that while we do our best to be available for urgent issues outside of office hours, we are not available 24/7.   If you have an urgent issue and are unable to reach us, you may choose to seek medical care at your doctor's office, retail clinic, urgent care center, or emergency room.  If you have a medical emergency, please immediately call 911 or go to the  emergency department.  Pager Numbers  - Dr. Kowalski: [phone]  - Dr. Moye: [phone]  - Dr. Stewart: [phone]  In the event of inclement weather, please call our main line at [phone] for an update on the status of any delays or closures.  Dermatology Medication Tips: Please keep the boxes that topical medications come in in order to help keep track of the instructions about where and how to use these. Pharmacies typically print the medication instructions only on the boxes and not directly on the medication tubes.   If your medication is too expensive, please contact our office at [phone] option 4 or send us a message through MyChart.   We are unable to tell what your co-pay for medications will be in advance as this is different depending on your insurance coverage. However, we may be able to find a substitute medication at lower cost or fill out paperwork to get insurance to cover a needed medication.   If a prior authorization is required to get your medication covered by your insurance company, please allow us 1-2 business days to complete this process.  Drug prices often vary depending on where the prescription is filled and some pharmacies may offer cheaper prices.  The website www.goodrx.com contains coupons for medications through different pharmacies. The prices here do not account for what the cost may be with help from insurance (it may be cheaper with your insurance), but the website can   give you the price if you did not use any insurance.  - You can print the associated coupon and take it with your prescription to the pharmacy.  - You may also stop by our office during regular business hours and pick up a GoodRx coupon card.  - If you need your prescription sent electronically to a different pharmacy, notify our office through [facility] MyChart or by phone at [phone] option 4.     Si Usted Necesita Algo Despus de Su Visita  Tambin puede  enviarnos un mensaje a travs de MyChart. Por lo general respondemos a los mensajes de MyChart en el transcurso de 1 a 2 das hbiles.  Para renovar recetas, por favor pida a su farmacia que se ponga en contacto con nuestra oficina. Nuestro nmero de fax es el [phone].  Si tiene un asunto urgente cuando la clnica est cerrada y que no puede esperar hasta el siguiente da hbil, puede llamar/localizar a su doctor(a) al nmero que aparece a continuacin.   Por favor, tenga en cuenta que aunque hacemos todo lo posible para estar disponibles para asuntos urgentes fuera del horario de oficina, no estamos disponibles las 24 horas del da, los 7 das de la semana.   Si tiene un problema urgente y no puede comunicarse con nosotros, puede optar por buscar atencin mdica  en el consultorio de su doctor(a), en una clnica privada, en un centro de atencin urgente o en una sala de emergencias.  Si tiene una emergencia mdica, por favor llame inmediatamente al 911 o vaya a la sala de emergencias.  Nmeros de bper  - Dr. Kowalski: [phone]  - Dra. Moye: [phone]  - Dra. Stewart: [phone]  En caso de inclemencias del tiempo, por favor llame a nuestra lnea principal al [phone] para una actualizacin sobre el estado de cualquier retraso o cierre.  Consejos para la medicacin en dermatologa: Por favor, guarde las cajas en las que vienen los medicamentos de uso tpico para ayudarle a seguir las instrucciones sobre dnde y cmo usarlos. Las farmacias generalmente imprimen las instrucciones del medicamento slo en las cajas y no directamente en los tubos del medicamento.   Si su medicamento es muy caro, por favor, pngase en contacto con nuestra oficina llamando al [phone] y presione la opcin 4 o envenos un mensaje a travs de MyChart.   No podemos decirle cul ser su copago por los medicamentos por adelantado ya que esto es diferente dependiendo de la cobertura de su seguro.  Sin embargo, es posible que podamos encontrar un medicamento sustituto a menor costo o llenar un formulario para que el seguro cubra el medicamento que se considera necesario.   Si se requiere una autorizacin previa para que su compaa de seguros cubra su medicamento, por favor permtanos de 1 a 2 das hbiles para completar este proceso.  Los precios de los medicamentos varan con frecuencia dependiendo del lugar de dnde se surte la receta y alguna farmacias pueden ofrecer precios ms baratos.  El sitio web www.goodrx.com tiene cupones para medicamentos de diferentes farmacias. Los precios aqu no tienen en cuenta lo que podra costar con la ayuda del seguro (puede ser ms barato con su seguro), pero el sitio web puede darle el precio si no utiliz ningn seguro.  - Puede imprimir el cupn correspondiente y llevarlo con su receta a la farmacia.  - Tambin puede pasar por nuestra oficina durante el horario de atencin regular y recoger una tarjeta de cupones de GoodRx.  -   Si necesita que su receta se enve electrnicamente a una farmacia diferente, informe a nuestra oficina a travs de MyChart de [facility] o por telfono llamando al [phone] y presione la opcin 4.  

## 2022-03-06 ENCOUNTER — Encounter: Payer: Self-pay | Admitting: Dermatology

## 2022-03-06 ENCOUNTER — Encounter: Payer: Self-pay | Admitting: Obstetrics and Gynecology

## 2022-03-06 NOTE — Progress Notes (Signed)
Follow-Up Visit   Subjective  Teresa Moran is a 56 y.o. female who presents for the following: Annual Exam (History of dysplastic nevus - The patient presents for Total-Body Skin Exam (TBSE) for skin cancer screening and mole check.  The patient has spots, moles and lesions to be evaluated, some may be new or changing and the patient has concerns that these could be cancer./).  The following portions of the chart were reviewed this encounter and updated as appropriate:   Tobacco  Allergies  Meds  Problems  Med Hx  Surg Hx  Fam Hx     Review of Systems:  No other skin or systemic complaints except as noted in HPI or Assessment and Plan.  Objective  Well appearing patient in no apparent distress; mood and affect are within normal limits.  A full examination was performed including scalp, head, eyes, ears, nose, lips, neck, chest, axillae, abdomen, back, buttocks, bilateral upper extremities, bilateral lower extremities, hands, feet, fingers, toes, fingernails, and toenails. All findings within normal limits unless otherwise noted below.  Mid Forehead Erythematous thin papules/macules with gritty scale.   Left Forearm - Posterior Erythematous stuck-on, waxy papule or plaque   Assessment & Plan   Lentigines - Scattered tan macules - Due to sun exposure - Benign-appearing, observe - Recommend daily broad spectrum sunscreen SPF 30+ to sun-exposed areas, reapply every 2 hours as needed. - Call for any changes  Seborrheic Keratoses - Stuck-on, waxy, tan-brown papules and/or plaques  - Benign-appearing - Discussed benign etiology and prognosis. - Observe - Call for any changes  Melanocytic Nevi - Tan-brown and/or pink-flesh-colored symmetric macules and papules - Benign appearing on exam today - Observation - Call clinic for new or changing moles - Recommend daily use of broad spectrum spf 30+ sunscreen to sun-exposed areas.   Hemangiomas - Red papules - Discussed  benign nature - Observe - Call for any changes  Actinic Damage - Chronic condition, secondary to cumulative UV/sun exposure - diffuse scaly erythematous macules with underlying dyspigmentation - Recommend daily broad spectrum sunscreen SPF 30+ to sun-exposed areas, reapply every 2 hours as needed.  - Staying in the shade or wearing long sleeves, sun glasses (UVA+UVB protection) and wide brim hats (4-inch brim around the entire circumference of the hat) are also recommended for sun protection.  - Call for new or changing lesions.  Skin cancer screening performed today.  Psoriasis Right Elbow - Posterior  Psoriasis is a chronic non-curable, but treatable genetic/hereditary disease that may have other systemic features affecting other organ systems such as joints (Psoriatic Arthritis). It is associated with an increased risk of inflammatory bowel disease, heart disease, non-alcoholic fatty liver disease, and depression.    Continue topical Tazarotene for Psoriatic plaques. See below note for Arthritis evaluation by Rheumatology:  Note from Coralie Carpen, [place] at [facility]: "History and physical are most persuasive for osteoarthritis of the hands and knees in the presence of plaque psoriasis, but without persuasive findings for psoriatic arthritis. She does not have gelling or prolonged morning stiffness, nor a history of uveitis or enthesitis. We will continue to monitor her symptoms with periodic follow-ups. Continue with topical regimen and prn meloxicam as currently prescribed. Continue with [facility]."  Continue Tazarotene 0.1% cream qhs prn  tazarotene (TAZORAC) 0.1 % cream - Right Elbow - Posterior Apply topically at bedtime.  AK (actinic keratosis) Mid Forehead  Destruction of lesion - Mid Forehead Complexity: simple   Destruction method: cryotherapy  Informed consent: discussed and consent obtained   Timeout:  patient name, date of birth, surgical site,  and procedure verified Lesion destroyed using liquid nitrogen: Yes   Region frozen until ice ball extended beyond lesion: Yes   Outcome: patient tolerated procedure well with no complications   Post-procedure details: wound care instructions given    Inflamed seborrheic keratosis Left Forearm - Posterior  Destruction of lesion - Left Forearm - Posterior Complexity: simple   Destruction method: cryotherapy   Informed consent: discussed and consent obtained   Timeout:  patient name, date of birth, surgical site, and procedure verified Lesion destroyed using liquid nitrogen: Yes   Region frozen until ice ball extended beyond lesion: Yes   Outcome: patient tolerated procedure well with no complications   Post-procedure details: wound care instructions given    Return in about 1 year (around 03/03/2023) for TBSE.  I, Ashok Cordia, CMA, am acting as scribe for Sarina Ser, MD . Documentation: I have reviewed the above documentation for accuracy and completeness, and I agree with the above.  Sarina Ser, MD

## 2022-04-06 ENCOUNTER — Telehealth: Payer: Self-pay

## 2022-04-06 ENCOUNTER — Encounter: Payer: Self-pay | Admitting: Obstetrics and Gynecology

## 2022-04-06 NOTE — Telephone Encounter (Signed)
Pt calling; called to schedule an appt for uti but was told she needed to speak with a triage nurse first; sxs are- painful urination since Sat; has taken AZO with minimal relief - still having pain; freq.  Adv will have schedulers call her and get her scheduled.  Pt states she would like antibx as soon as possible.

## 2022-04-07 ENCOUNTER — Ambulatory Visit: Payer: No Typology Code available for payment source

## 2022-04-07 ENCOUNTER — Ambulatory Visit (INDEPENDENT_AMBULATORY_CARE_PROVIDER_SITE_OTHER): Payer: No Typology Code available for payment source

## 2022-04-07 ENCOUNTER — Other Ambulatory Visit: Payer: No Typology Code available for payment source

## 2022-04-07 VITALS — BP 118/81 | HR 82 | Temp 97.9°F | Resp 16 | Wt 181.0 lb

## 2022-04-07 DIAGNOSIS — R300 Dysuria: Secondary | ICD-10-CM | POA: Diagnosis not present

## 2022-04-07 DIAGNOSIS — E119 Type 2 diabetes mellitus without complications: Secondary | ICD-10-CM

## 2022-04-07 LAB — POCT URINALYSIS DIPSTICK
Bilirubin, UA: NEGATIVE
Blood, UA: NEGATIVE
Glucose, UA: NEGATIVE
Ketones, UA: NEGATIVE
Nitrite, UA: NEGATIVE
Protein, UA: NEGATIVE
Spec Grav, UA: <=1.005 — AB (ref 1.010–1.025)
Urobilinogen, UA: 0.2 E.U./dL
pH, UA: 7 (ref 5.0–8.0)

## 2022-04-07 MED ORDER — SULFAMETHOXAZOLE-TRIMETHOPRIM 800-160 MG PO TABS: 1.0000 | ORAL_TABLET | Freq: Two times a day (BID) | ORAL | 0 refills | Status: DC

## 2022-04-07 NOTE — Addendum Note (Signed)
Addended by: Minette Headland on: 04/07/2022 09:43 AM   Modules accepted: Orders

## 2022-04-07 NOTE — Patient Instructions (Addendum)
Dysuria Dysuria is pain or discomfort during urination. The pain or discomfort may be felt in the part of the body that drains urine from the bladder (urethra) or in the surrounding tissue of the genitals. The pain may also be felt in the groin area, lower abdomen, or lower back. You may have to urinate frequently or have the sudden feeling that you have to urinate (urgency). Dysuria can affect anyone, but it is more common in females. Dysuria can be caused by many different things, including: Urinary tract infection. Kidney stones or bladder stones. Certain STIs (sexually transmitted infections), such as chlamydia. Dehydration. Inflammation of the tissues of the vagina. Use of certain medicines. Use of certain soaps or scented products that cause irritation. Follow these instructions at home: Medicines Take over-the-counter and prescription medicines only as told by your health care provider. If you were prescribed an antibiotic medicine, take it as told by your health care provider. Do not stop taking the antibiotic even if you start to feel better. Eating and drinking  Drink enough fluid to keep your urine pale yellow. Avoid caffeinated beverages, tea, and alcohol. These beverages can irritate the bladder and make dysuria worse. In males, alcohol may irritate the prostate. General instructions Watch your condition for any changes. Urinate often. Avoid holding urine for long periods of time. If you are female, you should wipe from front to back after urinating or having a bowel movement. Use each piece of toilet paper only once. Empty your bladder after sex. Keep all follow-up visits. This is important. If you had any tests done to find the cause of dysuria, it is up to you to get your test results. Ask your health care provider, or the department that is doing the test, when your results will be ready. Contact a health care provider if: You have a fever. You develop pain in your back or  sides. You have nausea or vomiting. You have blood in your urine. You are not urinating as often as you usually do. Get help right away if: Your pain is severe and not relieved with medicines. You cannot eat or drink without vomiting. You are confused. You have a rapid heartbeat while resting. You have shaking or chills. You feel extremely weak. Summary Dysuria is pain or discomfort while urinating. Many different conditions can lead to dysuria. If you have dysuria, you may have to urinate frequently or have the sudden feeling that you have to urinate (urgency). Watch your condition for any changes. Keep all follow-up visits. Make sure that you urinate often and drink enough fluid to keep your urine pale yellow. This information is not intended to replace advice given to you by your health care provider. Make sure you discuss any questions you have with your health care provider. Document Revised: 01/02/2020 Document Reviewed: 01/02/2020 Elsevier Patient Education  [place].   Sulfamethoxazole; Trimethoprim Tablets What is this medication? SULFAMETHOXAZOLE; TRIMETHOPRIM (suhl fuh meth OK suh zohl; trye METH oh prim) treats infections caused by bacteria. It belongs to a group of medications called sulfonamide antibiotics. It will not treat colds, the flu, or infections caused by viruses. This medicine may be used for other purposes; ask your health care provider or pharmacist if you have questions. COMMON BRAND NAME(S): Bacter-Aid DS, Bactrim, Bactrim DS, Septra, Septra DS What should I tell my care team before I take this medication? They need to know if you have any of these conditions: G6PD deficiency HIV or AIDS Kidney disease Liver  disease Low platelet levels Low red blood cell levels Poor nutrition Stomach or intestine problems, such as colitis Thyroid disease An unusual or allergic reaction to sulfamethoxazole, trimethoprim, other medications, foods, dyes, or  preservatives Pregnant or trying to get pregnant Breast-feeding How should I use this medication? Take this medication by mouth with a glass of water. Follow the directions on the prescription label. Take your medication at regular intervals. Do not take it more often than directed. Take all of your medication as directed even if you think you are better. Do not skip doses or stop your medication early. Talk to your care team about the use of this medication in children. While this medication may be prescribed for children as young as 2 months for selected conditions, precautions do apply. Overdosage: If you think you have taken too much of this medicine contact a poison control center or emergency room at once. NOTE: This medicine is only for you. Do not share this medicine with others. What if I miss a dose? If you miss a dose, take it as soon as you can. If it is almost time for your next dose, take only that dose. Do not take double or extra doses. What may interact with this medication? Do not take this medication with any of the following: Dofetilide This medication may also interact with the following: Amantadine Certain medications for blood pressure or heart disease Certain medications for depression, such as amitriptyline Certain medications for diabetes, such as glipizide or glyburide Certain medications that treat or prevent blood clots, such as warfarin Cyclosporine Digoxin Diuretics Estrogen and progestin hormones Indomethacin Methotrexate Phenytoin Procainamide Pyrimethamine Zidovudine This list may not describe all possible interactions. Give your health care provider a list of all the medicines, herbs, non-prescription drugs, or dietary supplements you use. Also tell them if you smoke, drink alcohol, or use illegal drugs. Some items may interact with your medicine. What should I watch for while using this medication? Tell your care team if your symptoms do not start to  get better or if they get worse. Do not treat diarrhea with over the counter products. Contact your care team if you have diarrhea that lasts more than 2 days or if it is severe and watery. This medication may cause serious skin reactions. They can happen weeks to months after starting the medication. Contact your care team right away if you notice fevers or flu-like symptoms with a rash. The rash may be red or purple and then turn into blisters or peeling of the skin. Or, you might notice a red rash with swelling of the face, lips or lymph nodes in your neck or under your arms. This medication can make you more sensitive to the sun. Keep out of the sun. If you cannot avoid being in the sun, wear protective clothing and sunscreen. Do not use sun lamps or tanning beds/booths. Be careful brushing or flossing your teeth or using a toothpick because you may get an infection or bleed more easily. If you have any dental work done, tell your dentist you are receiving this medication. What side effects may I notice from receiving this medication? Side effects that you should report to your care team as soon as possible: Allergic reactions--skin rash, itching, hives, swelling of the face, lips, tongue, or throat Aplastic anemia--unusual weakness or fatigue, dizziness, headache, trouble breathing, increased bleeding or bruising, fever, chills, cough, or sore throat Dry cough, shortness of breath or trouble breathing High potassium level--muscle weakness, fast  or irregular heartbeat Liver injury-- right upper belly pain, loss of appetite, nausea, light-colored stool, dark yellow or brown urine, yellowing skin or eyes, unusual weakness or fatigue Low blood sugar (hypoglycemia)--tremors or shaking, anxiety, sweating, cold or clammy skin, confusion, dizziness, rapid heartbeat Low sodium level--muscle weakness, fatigue, dizziness, headache, confusion Low thyroid levels (hypothyroidism)--unusual weakness or fatigue,  increased sensitivity to cold, constipation, hair loss, dry skin, weight gain, feelings of depression Rash, fever, and swollen lymph nodes Redness, blistering, peeling, or loosening of the skin, including inside the mouth Severe diarrhea, fever Small, pus-filled bumps on skin Unusual vaginal discharge, itching, or odor Side effects that usually do not require medical attention (report to your care team if they continue or are bothersome): Loss of appetite Nausea Vomiting This list may not describe all possible side effects. Call your doctor for medical advice about side effects. You may report side effects to FDA at 1-800-FDA-1088. Where should I keep my medication? Keep out of the reach of children. Store between 15 and 25 degrees C (59 to 77 degrees F). Protect from light. Keep the container tightly closed. Throw away any unused medication after the expiration date. NOTE: This sheet is a summary. It may not cover all possible information. If you have questions about this medicine, talk to your doctor, pharmacist, or health care provider.  2023 Elsevier/Gold Standard (2020-08-23 00:00:00)

## 2022-04-07 NOTE — Progress Notes (Signed)
Subjective:    Teresa Moran is a 56 y.o. female who complains of abnormal smelling urine, burning with urination, diarrhea, bilateral flank pain, frequency, incontinence, pain lower abdominal, suprapubic pressure, and urgency for 7 week.  Patient also complains of back pain and headache. Patient denies congestion, cough, fever, rhinitis, sorethroat, stomach ache, and vaginal discharge.  Patient does not have a history of recurrent UTI.  Patient does not have a history of pyelonephritis. The following portions of the patient's history were reviewed and updated as appropriate: allergies, current medications, and problem list. Review of Systems Pertinent items are noted in HPI.    Objective:    BP 118/81   Pulse 82   Temp 97.9 F (36.6 C) (Oral)   Resp 16   Wt 181 lb (82.1 kg)   LMP 11/06/2014   BMI 34.20 kg/m  General: alert and mild distress            Laboratory:  Urine dipstick shows 1+ for leukocyte esterase.   Micro exam: not done.    Assessment:    Acute cystitis    Plan: Plan:    1. Medications: TMP/SMX 2. Maintain adequate hydration 3. Follow up if symptoms not improving, and prn.   Nunzio Cory.[facility]

## 2022-04-07 NOTE — Telephone Encounter (Signed)
Patient seen on 04/07/22

## 2022-04-08 LAB — HEMOGLOBIN A1C
Est. average glucose Bld gHb Est-mCnc: 128 mg/dL
Hgb A1c MFr Bld: 6.1 % — ABNORMAL HIGH (ref 4.8–5.6)

## 2022-04-09 ENCOUNTER — Other Ambulatory Visit: Payer: Self-pay | Admitting: Obstetrics and Gynecology

## 2022-04-09 DIAGNOSIS — E119 Type 2 diabetes mellitus without complications: Secondary | ICD-10-CM

## 2022-04-09 MED ORDER — METFORMIN HCL 500 MG PO TABS: 500.0000 mg | ORAL_TABLET | Freq: Every evening | ORAL | 0 refills | Status: DC

## 2022-04-09 NOTE — Progress Notes (Signed)
Rx RF metformin due to stable HgA1c

## 2022-04-12 LAB — URINE CULTURE

## 2022-07-15 ENCOUNTER — Other Ambulatory Visit: Payer: Self-pay | Admitting: Obstetrics and Gynecology

## 2022-07-15 DIAGNOSIS — E119 Type 2 diabetes mellitus without complications: Secondary | ICD-10-CM

## 2022-09-11 ENCOUNTER — Encounter: Payer: Self-pay | Admitting: Obstetrics and Gynecology

## 2022-09-11 DIAGNOSIS — E119 Type 2 diabetes mellitus without complications: Secondary | ICD-10-CM

## 2022-09-15 ENCOUNTER — Other Ambulatory Visit: Payer: No Typology Code available for payment source

## 2022-09-15 DIAGNOSIS — E119 Type 2 diabetes mellitus without complications: Secondary | ICD-10-CM

## 2022-09-16 LAB — HEMOGLOBIN A1C
Est. average glucose Bld gHb Est-mCnc: 137 mg/dL
Hgb A1c MFr Bld: 6.4 % — ABNORMAL HIGH (ref 4.8–5.6)

## 2022-09-16 LAB — COMPREHENSIVE METABOLIC PANEL
ALT: 15 IU/L (ref 0–32)
AST: 15 IU/L (ref 0–40)
Albumin/Globulin Ratio: 2.3 — ABNORMAL HIGH (ref 1.2–2.2)
Albumin: 4.4 g/dL (ref 3.8–4.9)
Alkaline Phosphatase: 62 IU/L (ref 44–121)
BUN/Creatinine Ratio: 14 (ref 9–23)
BUN: 12 mg/dL (ref 6–24)
Bilirubin Total: 0.4 mg/dL (ref 0.0–1.2)
CO2: 25 mmol/L (ref 20–29)
Calcium: 9.8 mg/dL (ref 8.7–10.2)
Chloride: 101 mmol/L (ref 96–106)
Creatinine, Ser: 0.87 mg/dL (ref 0.57–1.00)
Globulin, Total: 1.9 g/dL (ref 1.5–4.5)
Glucose: 132 mg/dL — ABNORMAL HIGH (ref 70–99)
Potassium: 4.1 mmol/L (ref 3.5–5.2)
Sodium: 141 mmol/L (ref 134–144)
Total Protein: 6.3 g/dL (ref 6.0–8.5)
eGFR: 78 mL/min/{1.73_m2} (ref >59)

## 2022-09-17 ENCOUNTER — Other Ambulatory Visit: Payer: Self-pay | Admitting: Obstetrics and Gynecology

## 2022-09-17 DIAGNOSIS — E119 Type 2 diabetes mellitus without complications: Secondary | ICD-10-CM

## 2022-09-17 MED ORDER — METFORMIN HCL 500 MG PO TABS: 500.0000 mg | ORAL_TABLET | Freq: Every evening | ORAL | 1 refills | Status: DC

## 2022-09-17 NOTE — Progress Notes (Signed)
Rx RF metformin for 6 months

## 2022-11-13 ENCOUNTER — Encounter
Admission: RE | Admit: 2022-11-13 | Discharge: 2022-11-13 | Disposition: A | Discharge status: Home / Self Care | Payer: No Typology Code available for payment source | Source: Ambulatory Visit | Attending: Specialist | Admitting: Specialist

## 2022-11-13 ENCOUNTER — Other Ambulatory Visit: Payer: Self-pay

## 2022-11-13 VITALS — Ht 61.0 in | Wt 177.0 lb

## 2022-11-13 DIAGNOSIS — E119 Type 2 diabetes mellitus without complications: Secondary | ICD-10-CM | POA: Insufficient documentation

## 2022-11-13 DIAGNOSIS — E782 Mixed hyperlipidemia: Secondary | ICD-10-CM | POA: Insufficient documentation

## 2022-11-13 DIAGNOSIS — Z01810 Encounter for preprocedural cardiovascular examination: Secondary | ICD-10-CM | POA: Diagnosis not present

## 2022-11-13 DIAGNOSIS — I10 Essential (primary) hypertension: Secondary | ICD-10-CM | POA: Insufficient documentation

## 2022-11-13 NOTE — Patient Instructions (Signed)
Your procedure is scheduled on: Thursday 11/23/22 To find out your arrival time, please call [phone] between 1PM - 3PM on:  Wednesday 11/22/22  Report to the Registration Desk on the 1st floor of the [facility]. Free Valet parking is available.  If your arrival time is 6:00 am, do not arrive before that time as the [facility] entrance doors do not open until 6:00 am.  REMEMBER: Instructions that are not followed completely may result in serious medical risk, up to and including death; or upon the discretion of your surgeon and anesthesiologist your surgery may need to be rescheduled.  Do not eat food or drink any liquids after midnight the night before surgery.  No gum chewing or hard candies.  One week prior to surgery: Stop Anti-inflammatories (NSAIDS) such as Advil, Aleve, Ibuprofen, Motrin, Naproxen, Naprosyn and Aspirin based products such as Excedrin, Goody's Powder, BC Powder. You may however, continue to take Tylenol if needed for pain up until the day of surgery.  Stop ANY OVER THE COUNTER supplements until after surgery.  Continue taking all prescribed medications.   TAKE ONLY THESE MEDICATIONS THE MORNING OF SURGERY WITH A SIP OF WATER:  none  No Alcohol for 24 hours before or after surgery.  No Smoking including e-cigarettes for 24 hours before surgery.  No chewable tobacco products for at least 6 hours before surgery.  No nicotine patches on the day of surgery.  Do not use any "recreational" drugs for at least a week (preferably 2 weeks) before your surgery.  Please be advised that the combination of cocaine and anesthesia may have negative outcomes, up to and including death. If you test positive for cocaine, your surgery will be cancelled.  On the morning of surgery brush your teeth with toothpaste and water, you may rinse your mouth with mouthwash if you wish. Do not swallow any toothpaste or mouthwash.  Use CHG Soap or wipes as directed on instruction  sheet.  Do not wear lotions, powders, or perfumes.   Do not shave body hair from the neck down 48 hours before surgery.  Wear comfortable clothing (specific to your surgery type) to the hospital.  Do not wear jewelry, make-up, hairpins, clips or nail polish.  Contact lenses, hearing aids and dentures may not be worn into surgery.  Do not bring valuables to the hospital. [facility] is not responsible for any missing/lost belongings or valuables.   Notify your doctor if there is any change in your medical condition (cold, fever, infection).  If you are being discharged the day of surgery, you will not be allowed to drive home. You will need a responsible individual to drive you home and stay with you for 24 hours after surgery.   If you are taking public transportation, you will need to have a responsible individual with you.  If you are being admitted to the hospital overnight, leave your suitcase in the car. After surgery it may be brought to your room.  In case of increased patient census, it may be necessary for you, the patient, to continue your postoperative care in the Same Day Surgery department.  After surgery, you can help prevent lung complications by doing breathing exercises.  Take deep breaths and cough every 1-2 hours. Your doctor may order a device called an Incentive Spirometer to help you take deep breaths. When coughing or sneezing, hold a pillow firmly against your incision with both hands. This is called "splinting." Doing this helps protect your incision.  also decreases belly discomfort.  Surgery Visitation Policy:  Patients undergoing a surgery or procedure may have two family members or support persons with them as long as the person is not COVID-19 positive or experiencing its symptoms.   Inpatient Visitation:    Visiting hours are 7 a.m. to 8 p.m. Up to four visitors are allowed at one time in a patient room. The visitors may rotate out with other  people during the day. One designated support person (adult) may remain overnight.  Please call the Pre-admissions Testing Dept. at [phone] if you have any questions about these instructions.     Preparing for Surgery with CHLORHEXIDINE GLUCONATE (CHG) Soap  Chlorhexidine Gluconate (CHG) Soap  o An antiseptic cleaner that kills germs and bonds with the skin to continue killing germs even after washing  o Used for showering the night before surgery and morning of surgery  Before surgery, you can play an important role by reducing the number of germs on your skin.  CHG (Chlorhexidine gluconate) soap is an antiseptic cleanser which kills germs and bonds with the skin to continue killing germs even after washing.  Please do not use if you have an allergy to CHG or antibacterial soaps. If your skin becomes reddened/irritated stop using the CHG.  1. Shower the NIGHT BEFORE SURGERY and the MORNING OF SURGERY with CHG soap.  2. If you choose to wash your hair, wash your hair first as usual with your normal shampoo.  3. After shampooing, rinse your hair and body thoroughly to remove the shampoo.  4. Use CHG as you would any other liquid soap. You can apply CHG directly to the skin and wash gently with a scrungie or a clean washcloth.  5. Apply the CHG soap to your body only from the neck down. Do not use on open wounds or open sores. Avoid contact with your eyes, ears, mouth, and genitals (private parts). Wash face and genitals (private parts) with your normal soap.  6. Wash thoroughly, paying special attention to the area where your surgery will be performed.  7. Thoroughly rinse your body with warm water.  8. Do not shower/wash with your normal soap after using and rinsing off the CHG soap.  9. Pat yourself dry with a clean towel.  10. Wear clean pajamas to bed the night before surgery.  12. Place clean sheets on your bed the night of your first shower and do not sleep with  pets.  13. Shower again with the CHG soap on the day of surgery prior to arriving at the hospital.  14. Do not apply any deodorants/lotions/powders.  15. Please wear clean clothes to the hospital.  

## 2022-11-15 ENCOUNTER — Other Ambulatory Visit: Payer: Self-pay | Admitting: Specialist

## 2022-11-15 NOTE — H&P (Signed)
PREOPERATIVE H&P  Chief Complaint: Left crapal tunnel syndrome HPI: Teresa Moran is a 57 y.o. female who presents for preoperative history and physical with a diagnosis of Left crapal tunnel release.  She has failed conservative treatment including bracing, medication, and injections.  She has markedly positive nerve conduction studies.  Symptoms are rated as moderate to severe, and have been worsening.  This is significantly impairing activities of daily living.  She has elected for surgical management.   Past Medical History:  Diagnosis Date   Abnormal mammogram 2012   resolved   Arthritis    Atypical mole 02/2020   right mid back paraspinal   Complication of anesthesia    Diabetes mellitus without complication (HCC)    Family history of adverse reaction to anesthesia    mom-hard to wake up, n/v   Family history of ovarian cancer    Fundic gland polyps of stomach, benign    Glaucoma    History of mammogram 01/27/2016   Birads 1   History of Papanicolaou smear of cervix 01/17/2016   NIL/NEG   Hypertension    IBS (irritable bowel syndrome)    Mixed hyperlipidemia    Osteoarthritis    PONV (postoperative nausea and vomiting)    n/v   Rhinitis, allergic    Sleep apnea    does not use CPAP   Testing of female for genetic disease carrier status 02/2016   VISTASEQ Neg   Torn meniscus    RIGHT KNEE-PT STATES IT HAS HEALED BUT STILL HAS WEAKNESS AND PAIN IN KNEE   Type 2 diabetes mellitus (HCC)    Past Surgical History:  Procedure Laterality Date   COLONOSCOPY  07/2009;2014   benign polyps neg   COLONOSCOPY WITH PROPOFOL N/A 03/11/2018   repeat after 5 yrs due to polyp; Procedure: COLONOSCOPY WITH PROPOFOL;  Surgeon: Scot Jun, MD;  Location: [facility] ENDOSCOPY;  Service: Endoscopy;  Laterality: N/A;   DILATION AND CURETTAGE OF UTERUS     ESOPHAGOGASTRODUODENOSCOPY N/A 11/06/2014   Procedure: ESOPHAGOGASTRODUODENOSCOPY (EGD);  Surgeon: Elnita Maxwell, MD;  Location:  [facility] ENDOSCOPY;  Service: Endoscopy;  Laterality: N/A;  With Small Bowel Biopsies   GANGLION CYST EXCISION Left    Social History   Socioeconomic History   Marital status: Married    Spouse name: Not on file   Number of children: Not on file   Years of education: Not on file   Highest education level: Not on file  Occupational History   Not on file  Tobacco Use   Smoking status: Never   Smokeless tobacco: Never  Vaping Use   Vaping Use: Never used  Substance and Sexual Activity   Alcohol use: Yes    Comment: occ   Drug use: No   Sexual activity: Yes    Birth control/protection: Post-menopausal  Other Topics Concern   Not on file  Social History Narrative   Not on file   Social Determinants of Health   Financial Resource Strain: Not on file  Food Insecurity: Not on file  Transportation Needs: Not on file  Physical Activity: Not on file  Stress: Not on file  Social Connections: Not on file   Family History  Problem Relation Age of Onset   Hypertension Mother    Diabetes Father    Heart disease Father    Hypertension Father    Breast cancer Cousin 28   Ovarian cancer Maternal Aunt 59   Ovarian cancer Cousin 50   No Known  Allergies Prior to Admission medications   Medication Sig Start Date End Date Taking? Authorizing Provider  Cholecalciferol 50 MCG (2000 UT) CAPS Take 1 capsule by mouth daily.    [provider]  Cyanocobalamin (VITAMIN B-12 PO) Take 1 tablet by mouth daily.    [provider]  fluticasone (FLONASE) 50 MCG/ACT nasal spray USE 2 SPRAYS IN EACH  NOSTRIL DAILY AS NEEDED FOR ALLERGIES OR RHINITIS 02/07/22   Copland, Helmut Muster B, PA-C  gabapentin (NEURONTIN) 400 MG capsule gabapentin 400 mg capsule  TAKE 1 CAPSULE BY MOUTH TWICE A DAY AS NEEDED    [provider]  glucose blood test strip OneTouch Verio strips    [provider]  levocetirizine (XYZAL) 5 MG tablet Take 1 tablet (5 mg total) by mouth every evening.  02/07/22   Copland, Ilona Sorrel, PA-C  lisinopril-hydrochlorothiazide (ZESTORETIC) 20-12.5 MG tablet Take 1 tablet by mouth daily. 02/07/22   Copland, Ilona Sorrel, PA-C  meloxicam (MOBIC) 15 MG tablet TAKE 1 TABLET BY MOUTH ONCE A DAY AS NEEDED FOR PAIN 02/02/21   Copland, Helmut Muster B, PA-C  metFORMIN (GLUCOPHAGE) 500 MG tablet Take 1 tablet (500 mg total) by mouth every evening. 09/17/22   Copland, Helmut Muster B, PA-C  naproxen sodium (ALEVE) 220 MG tablet Take 220 mg by mouth daily as needed.    [provider]  Dola Argyle LANCETS 33G MISC OneTouch Delica Lancets 33 gauge    [provider]  pravastatin (PRAVACHOL) 20 MG tablet Take 1 tablet (20 mg total) by mouth daily. Patient taking differently: Take 20 mg by mouth every evening. 02/07/22   Copland, Ilona Sorrel, PA-C  tazarotene (TAZORAC) 0.1 % cream Apply topically at bedtime. Patient taking differently: Apply 1 application  topically at bedtime as needed. 03/02/22   Deirdre Evener, MD  triamcinolone cream (KENALOG) 0.1 % Apply 1 Application topically 2 (two) times daily as needed.    [provider]     Positive ROS: All other systems have been reviewed and were otherwise negative with the exception of those mentioned in the HPI and as above.  Physical Exam: General: Alert, no acute distress Cardiovascular: No pedal edema. Heart is regular and without murmur.  Respiratory: No cyanosis, no use of accessory musculature. Lungs are clear. GI: No organomegaly, abdomen is soft and non-tender Skin: No lesions in the area of chief complaint Neurologic: Sensation intact distally Psychiatric: Patient is competent for consent with normal mood and affect Lymphatic: No axillary or cervical lymphadenopathy  MUSCULOSKELETAL: Left hand shows weakness of pinch.  Gross sensation intact.  Median compression is positive.  Is minimal thenar wasting.  Grip is good in general.  Skins intact.  Assessment: Left carpal tunnel  release  Plan: Plan for Procedure(s): CARPAL TUNNEL RELEASE left  The risks benefits and alternatives were discussed with the patient including but not limited to the risks of nonoperative treatment, versus surgical intervention including infection, bleeding, nerve injury,  blood clots, cardiopulmonary complications, morbidity, mortality, among others, and they were willing to proceed.   Valinda Hoar, MD [phone]   11/15/2022 12:53 PM

## 2022-11-22 MED ORDER — ORAL CARE MOUTH RINSE: 15.0000 mL | Freq: Once | OROMUCOSAL | Status: AC

## 2022-11-22 MED ORDER — CEFAZOLIN SODIUM-DEXTROSE 2-4 GM/100ML-% IV SOLN
2.0000 g | INTRAVENOUS | Status: AC
  Administered 2022-11-23: 2 g via INTRAVENOUS

## 2022-11-22 MED ORDER — CHLORHEXIDINE GLUCONATE 0.12 % MT SOLN
15.0000 mL | Freq: Once | OROMUCOSAL | Status: AC
  Administered 2022-11-23: 15 mL via OROMUCOSAL

## 2022-11-22 MED ORDER — SODIUM CHLORIDE 0.9 % IV SOLN: INTRAVENOUS | Status: DC

## 2022-11-22 MED ORDER — FAMOTIDINE 20 MG PO TABS
20.0000 mg | ORAL_TABLET | Freq: Once | ORAL | Status: AC
  Administered 2022-11-23: 20 mg via ORAL

## 2022-11-22 MED ORDER — GABAPENTIN 300 MG PO CAPS
300.0000 mg | ORAL_CAPSULE | ORAL | Status: AC
  Administered 2022-11-23: 300 mg via ORAL

## 2022-11-22 MED ORDER — MELOXICAM 7.5 MG PO TABS
15.0000 mg | ORAL_TABLET | ORAL | Status: AC
  Administered 2022-11-23: 15 mg via ORAL

## 2022-11-22 MED ORDER — CHLORHEXIDINE GLUCONATE CLOTH 2 % EX PADS
6.0000 | MEDICATED_PAD | Freq: Once | CUTANEOUS | Status: AC
  Administered 2022-11-23: 6 via TOPICAL

## 2022-11-23 ENCOUNTER — Ambulatory Visit: Payer: No Typology Code available for payment source | Admitting: Certified Registered"

## 2022-11-23 ENCOUNTER — Other Ambulatory Visit: Payer: Self-pay

## 2022-11-23 ENCOUNTER — Encounter
Admission: RE | Disposition: A | Discharge status: Home / Self Care | Payer: Self-pay | Source: Ambulatory Visit | Attending: Specialist

## 2022-11-23 ENCOUNTER — Ambulatory Visit
Admission: RE | Admit: 2022-11-23 | Discharge: 2022-11-23 | Disposition: A | Discharge status: Home / Self Care | Payer: No Typology Code available for payment source | Source: Ambulatory Visit | Attending: Specialist | Admitting: Specialist

## 2022-11-23 DIAGNOSIS — G5602 Carpal tunnel syndrome, left upper limb: Secondary | ICD-10-CM | POA: Diagnosis present

## 2022-11-23 DIAGNOSIS — M1990 Unspecified osteoarthritis, unspecified site: Secondary | ICD-10-CM | POA: Diagnosis not present

## 2022-11-23 DIAGNOSIS — E119 Type 2 diabetes mellitus without complications: Secondary | ICD-10-CM | POA: Insufficient documentation

## 2022-11-23 DIAGNOSIS — I10 Essential (primary) hypertension: Secondary | ICD-10-CM | POA: Insufficient documentation

## 2022-11-23 DIAGNOSIS — G4730 Sleep apnea, unspecified: Secondary | ICD-10-CM | POA: Insufficient documentation

## 2022-11-23 DIAGNOSIS — Z7984 Long term (current) use of oral hypoglycemic drugs: Secondary | ICD-10-CM | POA: Diagnosis not present

## 2022-11-23 HISTORY — PX: CARPAL TUNNEL RELEASE: SHX101

## 2022-11-23 LAB — GLUCOSE, CAPILLARY
Glucose-Capillary: 108 mg/dL — ABNORMAL HIGH (ref 70–99)
Glucose-Capillary: 115 mg/dL — ABNORMAL HIGH (ref 70–99)

## 2022-11-23 SURGERY — CARPAL TUNNEL RELEASE
Anesthesia: General | Site: Wrist | Laterality: Left

## 2022-11-23 MED ORDER — ACETAMINOPHEN 10 MG/ML IV SOLN
INTRAVENOUS | Status: DC | PRN
  Administered 2022-11-23: 1000 mg via INTRAVENOUS

## 2022-11-23 MED ORDER — HYDROCODONE-ACETAMINOPHEN 5-325 MG PO TABS: 1.0000 | ORAL_TABLET | Freq: Four times a day (QID) | ORAL | 0 refills | Status: DC | PRN

## 2022-11-23 MED ORDER — GABAPENTIN 300 MG PO CAPS
ORAL_CAPSULE | ORAL | Status: AC
  Filled 2022-11-23: qty 1

## 2022-11-23 MED ORDER — MIDAZOLAM HCL 2 MG/2ML IJ SOLN
INTRAMUSCULAR | Status: DC | PRN
  Administered 2022-11-23: 2 mg via INTRAVENOUS

## 2022-11-23 MED ORDER — SODIUM CHLORIDE 0.9 % IR SOLN
Status: DC | PRN
  Administered 2022-11-23: 502 mL

## 2022-11-23 MED ORDER — PHENYLEPHRINE HCL (PRESSORS) 10 MG/ML IV SOLN
INTRAVENOUS | Status: DC | PRN
  Administered 2022-11-23: 80 ug via INTRAVENOUS

## 2022-11-23 MED ORDER — PROPOFOL 10 MG/ML IV BOLUS
INTRAVENOUS | Status: DC | PRN
  Administered 2022-11-23: 150 mg via INTRAVENOUS

## 2022-11-23 MED ORDER — MELOXICAM 7.5 MG PO TABS
ORAL_TABLET | ORAL | Status: AC
  Filled 2022-11-23: qty 2

## 2022-11-23 MED ORDER — CHLORHEXIDINE GLUCONATE 0.12 % MT SOLN
OROMUCOSAL | Status: AC
  Filled 2022-11-23: qty 15

## 2022-11-23 MED ORDER — NEOMYCIN-POLYMYXIN B GU 40-200000 IR SOLN
Status: AC
  Filled 2022-11-23: qty 20

## 2022-11-23 MED ORDER — FAMOTIDINE 20 MG PO TABS
ORAL_TABLET | ORAL | Status: AC
  Filled 2022-11-23: qty 1

## 2022-11-23 MED ORDER — FENTANYL CITRATE (PF) 100 MCG/2ML IJ SOLN
INTRAMUSCULAR | Status: DC | PRN
  Administered 2022-11-23 (×2): 50 ug via INTRAVENOUS

## 2022-11-23 MED ORDER — ONDANSETRON HCL 4 MG/2ML IJ SOLN
INTRAMUSCULAR | Status: DC | PRN
  Administered 2022-11-23: 4 mg via INTRAVENOUS

## 2022-11-23 MED ORDER — ACETAMINOPHEN 10 MG/ML IV SOLN
INTRAVENOUS | Status: AC
  Filled 2022-11-23: qty 100

## 2022-11-23 MED ORDER — FENTANYL CITRATE (PF) 100 MCG/2ML IJ SOLN
INTRAMUSCULAR | Status: AC
  Filled 2022-11-23: qty 2

## 2022-11-23 MED ORDER — DEXAMETHASONE SODIUM PHOSPHATE 10 MG/ML IJ SOLN
INTRAMUSCULAR | Status: DC | PRN
  Administered 2022-11-23: 10 mg via INTRAVENOUS

## 2022-11-23 MED ORDER — GABAPENTIN 400 MG PO CAPS: 400.0000 mg | ORAL_CAPSULE | Freq: Three times a day (TID) | ORAL | 3 refills | Status: AC

## 2022-11-23 MED ORDER — CEFAZOLIN SODIUM-DEXTROSE 2-4 GM/100ML-% IV SOLN
INTRAVENOUS | Status: AC
  Filled 2022-11-23: qty 100

## 2022-11-23 MED ORDER — BUPIVACAINE HCL (PF) 0.5 % IJ SOLN
INTRAMUSCULAR | Status: AC
  Filled 2022-11-23: qty 30

## 2022-11-23 MED ORDER — MIDAZOLAM HCL 2 MG/2ML IJ SOLN
INTRAMUSCULAR | Status: AC
  Filled 2022-11-23: qty 2

## 2022-11-23 MED ORDER — LIDOCAINE HCL (CARDIAC) PF 100 MG/5ML IV SOSY
PREFILLED_SYRINGE | INTRAVENOUS | Status: DC | PRN
  Administered 2022-11-23: 50 mg via INTRAVENOUS

## 2022-11-23 MED ORDER — MELOXICAM 15 MG PO TABS: 15.0000 mg | ORAL_TABLET | Freq: Every day | ORAL | 3 refills | Status: AC

## 2022-11-23 MED ORDER — BUPIVACAINE HCL 0.5 % IJ SOLN
INTRAMUSCULAR | Status: DC | PRN
  Administered 2022-11-23: 17 mL

## 2022-11-23 MED ORDER — KETOROLAC TROMETHAMINE 30 MG/ML IJ SOLN
INTRAMUSCULAR | Status: DC | PRN
  Administered 2022-11-23: 30 mg via INTRAVENOUS

## 2022-11-23 SURGICAL SUPPLY — 31 items
APL PRP STRL LF DISP 70% ISPRP (MISCELLANEOUS) ×1
BLADE SURG MINI STRL (BLADE) ×1 IMPLANT
BNDG ESMARCH 4 X 12 STRL LF (GAUZE/BANDAGES/DRESSINGS) ×1
BNDG ESMARCH 4X12 STRL LF (GAUZE/BANDAGES/DRESSINGS) ×1 IMPLANT
CHLORAPREP W/TINT 26 (MISCELLANEOUS) ×1 IMPLANT
CUFF TOURN SGL QUICK 18X4 (TOURNIQUET CUFF) IMPLANT
DRSG GAUZE FLUFF 36X18 (GAUZE/BANDAGES/DRESSINGS) ×2 IMPLANT
ELECT REM PT RETURN 9FT ADLT (ELECTROSURGICAL) ×1
ELECTRODE REM PT RTRN 9FT ADLT (ELECTROSURGICAL) ×1 IMPLANT
GAUZE XEROFORM 1X8 LF (GAUZE/BANDAGES/DRESSINGS) ×1 IMPLANT
GLOVE BIO SURGEON STRL SZ8 (GLOVE) ×1 IMPLANT
GOWN STRL REUS W/ TWL LRG LVL3 (GOWN DISPOSABLE) ×1 IMPLANT
GOWN STRL REUS W/TWL LRG LVL3 (GOWN DISPOSABLE) ×1
GOWN STRL REUS W/TWL LRG LVL4 (GOWN DISPOSABLE) ×1 IMPLANT
KIT TURNOVER KIT A (KITS) ×1 IMPLANT
MANIFOLD NEPTUNE II (INSTRUMENTS) ×1 IMPLANT
NS IRRIG 500ML POUR BTL (IV SOLUTION) ×1 IMPLANT
PACK EXTREMITY ARMC (MISCELLANEOUS) ×1 IMPLANT
PAD PREP OB/GYN DISP 24X41 (PERSONAL CARE ITEMS) ×1 IMPLANT
PADDING CAST BLEND 4X4 STRL (MISCELLANEOUS) ×1 IMPLANT
SPLINT CAST 1 STEP 3X12 (MISCELLANEOUS) ×1 IMPLANT
STOCKINETTE 48X4 2 PLY STRL (GAUZE/BANDAGES/DRESSINGS) ×1 IMPLANT
STOCKINETTE BIAS CUT 4 980044 (GAUZE/BANDAGES/DRESSINGS) ×1 IMPLANT
STOCKINETTE STRL 4IN 9604848 (GAUZE/BANDAGES/DRESSINGS) ×1 IMPLANT
STOCKINETTE STRL 6IN 960660 (GAUZE/BANDAGES/DRESSINGS) ×1 IMPLANT
SUT ETHILON 4-0 (SUTURE)
SUT ETHILON 4-0 FS2 18XMFL BLK (SUTURE)
SUT ETHILON 5-0 FS-2 18 BLK (SUTURE) ×1 IMPLANT
SUTURE ETHLN 4-0 FS2 18XMF BLK (SUTURE) ×1 IMPLANT
TRAP FLUID SMOKE EVACUATOR (MISCELLANEOUS) ×1 IMPLANT
WATER STERILE IRR 500ML POUR (IV SOLUTION) ×1 IMPLANT

## 2022-11-23 NOTE — H&P (Signed)
THE PATIENT WAS SEEN PRIOR TO SURGERY TODAY.  HISTORY, ALLERGIES, HOME MEDICATIONS AND OPERATIVE PROCEDURE WERE REVIEWED. RISKS AND BENEFITS OF SURGERY DISCUSSED WITH PATIENT AGAIN.  NO CHANGES FROM INITIAL HISTORY AND PHYSICAL NOTED.    

## 2022-11-23 NOTE — Op Note (Signed)
11/23/2022  12:51 PM  PATIENT:  Teresa Moran    PRE-OPERATIVE DIAGNOSIS: LEFT CARPAL TUNNEL SYNDROME  POST-OPERATIVE DIAGNOSIS: LEFT CARPAL TUNNEL SYNDROME  PROCEDURE:  LEFT CARPAL TUNNEL RELEASE  SURGEON: Valinda Hoar, MD   TOURNIQUET TIME: 19  MIN   ANESTHESIA:   General  PREOPERATIVE INDICATIONS:  Teresa Moran is a  57 y.o. female with a diagnosis of left carpal tunnel syndrome who failed conservative measures and elected for surgical management.    The risks benefits and alternatives were discussed with the patient preoperatively including but not limited to the risks of infection, bleeding, nerve injury, incomplete relief of symptoms, pillar pain, cardiopulmonary complications, the need for revision surgery, among others, and the patient was willing to proceed.  OPERATIVE FINDINGS: Thickened volar ligament and nerve compression.  OPERATIVE PROCEDURE: The patient is brought to the operating room placed in the supine position. General anesthesia was administered. The left upper extremity was prepped and draped in usual sterile fashion. Time out was performed. The arm was elevated and exsanguinated and the tourniquet was inflated. Incision was made in line with the radial border of the ring finger. The carpal tunnel transverse fascia was identified, cleaned, and incised sharply. The common sensory branches were visualized along with the superficial palmar arch and protected.  The median nerve was protected below. A Kelly clamp was  placed underneath the transverse carpal ligament, protecting the nerve. I released the ligament completely, and then released the proximal distal volar forearm fascia. The nerve was identified, and visualized, and protected throughout the case. The motor branch was intact upon inspection. No masses or abnormalities were identified in the ulnar bursa.  The wounds were irrigated copiously and the skin closed with nylon. The wound was injected with 1/2 %  marcaine followed by a sterile dressing and volar splint. Tourniquet was deflated with good return of blood flow to all fingers. Sponge and needle counts were correct.  The patient tolerated this well, with no complications. The patient was awakened and taken to recovery in good condition.

## 2022-11-23 NOTE — Anesthesia Procedure Notes (Signed)
Procedure Name: LMA Insertion Date/Time: 11/23/2022 12:00 PM  Performed by: Cheral Bay, CRNAPre-anesthesia Checklist: Patient identified, Emergency Drugs available, Suction available and Patient being monitored Patient Re-evaluated:Patient Re-evaluated prior to induction Oxygen Delivery Method: Circle system utilized Preoxygenation: Pre-oxygenation with 100% oxygen Induction Type: IV induction Ventilation: Mask ventilation without difficulty LMA: LMA inserted LMA Size: 4.0 Tube type: Oral Number of attempts: 1 Placement Confirmation: positive ETCO2 and breath sounds checked- equal and bilateral Tube secured with: Tape Dental Injury: Teeth and Oropharynx as per pre-operative assessment

## 2022-11-23 NOTE — Discharge Instructions (Signed)
AMBULATORY SURGERY  DISCHARGE INSTRUCTIONS   The drugs that you were given will stay in your system until tomorrow so for the next 24 hours you should not:  Drive an automobile Make any legal decisions Drink any alcoholic beverage   You may resume regular meals tomorrow.  Today it is better to start with liquids and gradually work up to solid foods.  You may eat anything you prefer, but it is better to start with liquids, then soup and crackers, and gradually work up to solid foods.   Please notify your doctor immediately if you have any unusual bleeding, trouble breathing, redness and pain at the surgery site, drainage, fever, or pain not relieved by medication.       Please contact your physician with any problems or Same Day Surgery at [phone], Monday through Friday 6 am to 4 pm, or [place] at  Main number at [phone].  

## 2022-11-23 NOTE — Anesthesia Postprocedure Evaluation (Signed)
Anesthesia Post Note  Patient: Teresa Moran  Procedure(s) Performed: CARPAL TUNNEL RELEASE (Left: Wrist)  Patient location during evaluation: PACU Anesthesia Type: General Level of consciousness: awake and alert Pain management: pain level controlled Vital Signs Assessment: post-procedure vital signs reviewed and stable Respiratory status: spontaneous breathing, nonlabored ventilation, respiratory function stable and patient connected to nasal cannula oxygen Cardiovascular status: blood pressure returned to baseline and stable Postop Assessment: no apparent nausea or vomiting Anesthetic complications: no   No notable events documented.   Last Vitals:  Vitals:   11/23/22 1310 11/23/22 1315  BP:    Pulse: 61 69  Resp: 16 (!) 29  Temp:    SpO2: 98% 96%    Last Pain:  Vitals:   11/23/22 1310  TempSrc:   PainSc: 0-No pain                 Louie Boston

## 2022-11-23 NOTE — Transfer of Care (Signed)
Immediate Anesthesia Transfer of Care Note  Patient: Teresa Moran  Procedure(s) Performed: CARPAL TUNNEL RELEASE (Left: Wrist)  Patient Location: PACU  Anesthesia Type:General  Level of Consciousness: drowsy  Airway & Oxygen Therapy: Patient Spontanous Breathing  Post-op Assessment: Report given to RN and Post -op Vital signs reviewed and stable  Post vital signs: Reviewed and stable  Last Vitals:  Vitals Value Taken Time  BP 146/92 11/23/22 1252  Temp    Pulse 68 11/23/22 1254  Resp 14 11/23/22 1254  SpO2 95 % 11/23/22 1254  Vitals shown include unvalidated device data.  Last Pain:  Vitals:   11/23/22 1015  TempSrc: Oral         Complications: No notable events documented.

## 2022-11-23 NOTE — Anesthesia Preprocedure Evaluation (Addendum)
Anesthesia Evaluation  Patient identified by MRN, date of birth, ID band Patient awake    Reviewed: Allergy & Precautions, NPO status , Patient's Chart, lab work & pertinent test results  History of Anesthesia Complications (+) PONV and history of anesthetic complications  Airway Mallampati: II  TM Distance: >3 FB Neck ROM: full    Dental no notable dental hx.    Pulmonary sleep apnea    Pulmonary exam normal        Cardiovascular hypertension, On Medications Normal cardiovascular exam     Neuro/Psych negative neurological ROS  negative psych ROS   GI/Hepatic negative GI ROS, Neg liver ROS,,,  Endo/Other  diabetes, Well Controlled, Type 2, Oral Hypoglycemic Agents    Renal/GU      Musculoskeletal  (+) Arthritis , Osteoarthritis,    Abdominal   Peds  Hematology negative hematology ROS (+)   Anesthesia Other Findings Past Medical History: 2012: Abnormal mammogram     Comment:  resolved No date: Arthritis 02/2020: Atypical mole     Comment:  right mid back paraspinal No date: Complication of anesthesia No date: Diabetes mellitus without complication (HCC) No date: Family history of adverse reaction to anesthesia     Comment:  mom-hard to wake up, n/v No date: Family history of ovarian cancer No date: Fundic gland polyps of stomach, benign No date: Glaucoma 01/27/2016: History of mammogram     Comment:  Birads 1 01/17/2016: History of Papanicolaou smear of cervix     Comment:  NIL/NEG No date: Hypertension No date: IBS (irritable bowel syndrome) No date: Mixed hyperlipidemia No date: Osteoarthritis No date: PONV (postoperative nausea and vomiting)     Comment:  n/v No date: Rhinitis, allergic No date: Sleep apnea     Comment:  does not use CPAP 02/2016: Testing of female for genetic disease carrier status     Comment:  VISTASEQ Neg No date: Torn meniscus     Comment:  RIGHT KNEE-PT STATES IT HAS  HEALED BUT STILL HAS               WEAKNESS AND PAIN IN KNEE No date: Type 2 diabetes mellitus (HCC)  Past Surgical History: 07/2009;2014: COLONOSCOPY     Comment:  benign polyps neg 03/11/2018: COLONOSCOPY WITH PROPOFOL; N/A     Comment:  repeat after 5 yrs due to polyp; Procedure: COLONOSCOPY               WITH PROPOFOL;  Surgeon: Scot Jun, MD;                Location: [facility] ENDOSCOPY;  Service: Endoscopy;                Laterality: N/A; No date: DILATION AND CURETTAGE OF UTERUS 11/06/2014: ESOPHAGOGASTRODUODENOSCOPY; N/A     Comment:  Procedure: ESOPHAGOGASTRODUODENOSCOPY (EGD);  Surgeon:               Elnita Maxwell, MD;  Location: [facility] ENDOSCOPY;                Service: Endoscopy;  Laterality: N/A;  With Small Bowel               Biopsies No date: GANGLION CYST EXCISION; Left     Reproductive/Obstetrics negative OB ROS                              Anesthesia Physical Anesthesia Plan  ASA: 2  Anesthesia  Plan: General LMA   Post-op Pain Management: Toradol IV (intra-op)* and Ofirmev IV (intra-op)*   Induction: Intravenous  PONV Risk Score and Plan: 4 or greater and Dexamethasone, Ondansetron, Midazolam and Treatment may vary due to age or medical condition  Airway Management Planned: LMA  Additional Equipment:   Intra-op Plan:   Post-operative Plan: Extubation in OR  Informed Consent: I have reviewed the patients History and Physical, chart, labs and discussed the procedure including the risks, benefits and alternatives for the proposed anesthesia with the patient or authorized representative who has indicated his/her understanding and acceptance.     Dental Advisory Given  Plan Discussed with: Anesthesiologist, CRNA and Surgeon  Anesthesia Plan Comments: (Patient consented for risks of anesthesia including but not limited to:  - adverse reactions to medications - damage to eyes, teeth, lips or other oral mucosa - nerve  damage due to positioning  - sore throat or hoarseness - Damage to heart, brain, nerves, lungs, other parts of body or loss of life  Patient voiced understanding.)         Anesthesia Quick Evaluation

## 2022-11-24 ENCOUNTER — Encounter: Payer: Self-pay | Admitting: Specialist

## 2022-12-11 ENCOUNTER — Other Ambulatory Visit: Payer: Self-pay | Admitting: Obstetrics and Gynecology

## 2022-12-11 DIAGNOSIS — I10 Essential (primary) hypertension: Secondary | ICD-10-CM

## 2022-12-11 DIAGNOSIS — E119 Type 2 diabetes mellitus without complications: Secondary | ICD-10-CM

## 2023-01-05 ENCOUNTER — Encounter: Payer: Self-pay | Admitting: Obstetrics and Gynecology

## 2023-01-23 ENCOUNTER — Encounter: Payer: Self-pay | Admitting: Obstetrics and Gynecology

## 2023-01-23 DIAGNOSIS — R197 Diarrhea, unspecified: Secondary | ICD-10-CM

## 2023-01-26 ENCOUNTER — Other Ambulatory Visit: Payer: Self-pay

## 2023-01-26 ENCOUNTER — Other Ambulatory Visit: Payer: No Typology Code available for payment source

## 2023-01-26 DIAGNOSIS — R197 Diarrhea, unspecified: Secondary | ICD-10-CM

## 2023-01-26 NOTE — Telephone Encounter (Signed)
Pt called triage, she went to [facility] site and order was not in system. I advised order was in there I just needed to release it. I have released order and she should be good now. She will let me know if she has any issues.

## 2023-01-26 NOTE — Addendum Note (Signed)
Addended by: Kathlene Cote on: 01/26/2023 10:13 AM   Modules accepted: Orders

## 2023-01-31 ENCOUNTER — Other Ambulatory Visit: Payer: Self-pay | Admitting: Obstetrics and Gynecology

## 2023-01-31 DIAGNOSIS — E119 Type 2 diabetes mellitus without complications: Secondary | ICD-10-CM

## 2023-02-01 LAB — GIARDIA, EIA; OVA/PARASITE: Giardia Ag, Stl: NEGATIVE

## 2023-02-08 LAB — HM DIABETES EYE EXAM

## 2023-02-15 ENCOUNTER — Encounter: Payer: Self-pay | Admitting: Obstetrics and Gynecology

## 2023-02-15 ENCOUNTER — Telehealth: Payer: Self-pay | Admitting: Obstetrics and Gynecology

## 2023-02-15 DIAGNOSIS — Z0000 Encounter for general adult medical examination without abnormal findings: Secondary | ICD-10-CM

## 2023-02-15 DIAGNOSIS — Z13220 Encounter for screening for lipoid disorders: Secondary | ICD-10-CM

## 2023-02-15 DIAGNOSIS — Z1321 Encounter for screening for nutritional disorder: Secondary | ICD-10-CM

## 2023-02-15 DIAGNOSIS — R7303 Prediabetes: Secondary | ICD-10-CM

## 2023-02-15 DIAGNOSIS — I10 Essential (primary) hypertension: Secondary | ICD-10-CM

## 2023-02-15 NOTE — Telephone Encounter (Signed)
Good Morning Teresa Moran, Patient called this morning, to make sure the labs you requested has been ordered.   She has a lab appointment set for 02/20/2023 ay 8:40 am.  Please advise.  CJ

## 2023-02-16 NOTE — Telephone Encounter (Signed)
Lab orders placed.  

## 2023-02-20 ENCOUNTER — Other Ambulatory Visit: Payer: No Typology Code available for payment source

## 2023-02-20 DIAGNOSIS — Z13220 Encounter for screening for lipoid disorders: Secondary | ICD-10-CM

## 2023-02-20 DIAGNOSIS — Z1321 Encounter for screening for nutritional disorder: Secondary | ICD-10-CM

## 2023-02-20 DIAGNOSIS — R7303 Prediabetes: Secondary | ICD-10-CM

## 2023-02-20 DIAGNOSIS — Z0000 Encounter for general adult medical examination without abnormal findings: Secondary | ICD-10-CM

## 2023-02-20 DIAGNOSIS — I10 Essential (primary) hypertension: Secondary | ICD-10-CM

## 2023-02-22 ENCOUNTER — Encounter: Payer: Self-pay | Admitting: Obstetrics and Gynecology

## 2023-02-22 ENCOUNTER — Ambulatory Visit: Payer: No Typology Code available for payment source | Admitting: Obstetrics and Gynecology

## 2023-02-22 VITALS — BP 108/70 | Ht 61.0 in | Wt 177.0 lb

## 2023-02-22 DIAGNOSIS — I10 Essential (primary) hypertension: Secondary | ICD-10-CM

## 2023-02-22 DIAGNOSIS — Z01419 Encounter for gynecological examination (general) (routine) without abnormal findings: Secondary | ICD-10-CM | POA: Diagnosis not present

## 2023-02-22 DIAGNOSIS — E119 Type 2 diabetes mellitus without complications: Secondary | ICD-10-CM

## 2023-02-22 DIAGNOSIS — Z1231 Encounter for screening mammogram for malignant neoplasm of breast: Secondary | ICD-10-CM

## 2023-02-22 DIAGNOSIS — J309 Allergic rhinitis, unspecified: Secondary | ICD-10-CM

## 2023-02-22 MED ORDER — METFORMIN HCL 500 MG PO TABS
500.0000 mg | ORAL_TABLET | Freq: Every evening | ORAL | 1 refills | Status: DC
Start: 2023-02-22 — End: 2023-07-14

## 2023-02-22 MED ORDER — LEVOCETIRIZINE DIHYDROCHLORIDE 5 MG PO TABS
5.0000 mg | ORAL_TABLET | Freq: Every evening | ORAL | 3 refills | Status: AC
Start: 2023-02-22 — End: ?

## 2023-02-22 MED ORDER — LISINOPRIL-HYDROCHLOROTHIAZIDE 20-12.5 MG PO TABS
1.0000 | ORAL_TABLET | Freq: Every day | ORAL | 3 refills | Status: DC
Start: 2023-02-22 — End: 2024-04-21

## 2023-02-22 MED ORDER — FLUTICASONE PROPIONATE 50 MCG/ACT NA SUSP
NASAL | 3 refills | Status: AC
Start: 2023-02-22 — End: ?

## 2023-02-22 MED ORDER — PRAVASTATIN SODIUM 20 MG PO TABS
20.0000 mg | ORAL_TABLET | Freq: Every evening | ORAL | 3 refills | Status: DC
Start: 2023-02-22 — End: 2024-04-21

## 2023-02-22 NOTE — Patient Instructions (Signed)
I value your feedback and you entrusting [place] with your care. If you get a [facility] patient survey, I would appreciate you taking the time to let us know about your experience today. Thank you!   [facility]: [phone]

## 2023-02-22 NOTE — Progress Notes (Signed)
Chief Complaint  Patient presents with   Gynecologic Exam    No concerns    HPI:      [name] is a 57 y.o. G2P1010 whose LMP was Patient's last menstrual period was 11/06/2014., presents today for her annual examination.  Her menses are absent due to menopause. She does not have PMB. She does have vasomotor sx day and night.   Sex activity: single partner, contraception - post menopausal status. She does not have vaginal dryness occas.   Last Pap: 02/07/22  Results were: no abnormalities /neg HPV DNA. Hx of STDs: none  Last mammogram: 03/03/22 at [facility], Results were: normal--routine follow-up in 12 months; has appt already There is a FH of breast cancer in her pat cousin. There is a FH of ovarian cancer in her cousin and possibly mat aunt. Vistaseq testing was neg 2018. The patient does  self-breast exams.  Colonoscopy: colonoscopy 10/19 with polyps, hx of polyps on prior colonoscopy too. Repeat due after 5 yrs per [facility] (pt confirmed with them); has appt 1/25.  Tobacco use: The patient denies current or previous tobacco use. Alcohol use: none  Drug use: none Exercise: min active  She does get adequate calcium and Vitamin D in her diet.  She has HTN and is doing well on lisinopril/hydrochlorothiazide, no side effects. She has type 2 DM, HgA1C 9/24 was 6.4%, recheck due 2/25. Taking metformin 500 mg QD. She gets eye exams yearly. Lipids good on pravastatin 20 mg daily 9/24. No side effects with meds.   Needs RF on xyzal and flonase prn for allergies. Knee pain improved with gel and steroid injections.     Past Medical History:  Diagnosis Date   Abnormal mammogram 2012   resolved   Arthritis    Atypical mole 02/2020   right mid back paraspinal   Complication of anesthesia    Diabetes mellitus without complication (HCC)    Family history of adverse reaction to anesthesia    mom-hard to wake up, n/v   Family history of ovarian cancer    Fundic gland polyps of stomach,  benign    Glaucoma    History of mammogram 01/27/2016   Birads 1   History of Papanicolaou smear of cervix 01/17/2016   NIL/NEG   Hypertension    IBS (irritable bowel syndrome)    Mixed hyperlipidemia    Osteoarthritis    PONV (postoperative nausea and vomiting)    n/v   Rhinitis, allergic    Sleep apnea    does not use CPAP   Testing of female for genetic disease carrier status 02/2016   VISTASEQ Neg   Torn meniscus    RIGHT KNEE-PT STATES IT HAS HEALED BUT STILL HAS WEAKNESS AND PAIN IN KNEE   Type 2 diabetes mellitus (HCC)     Past Surgical History:  Procedure Laterality Date   CARPAL TUNNEL RELEASE Left 11/23/2022   Procedure: CARPAL TUNNEL RELEASE;  Surgeon: Deeann Saint, MD;  Location: [facility] ORS;  Service: Orthopedics;  Laterality: Left;   COLONOSCOPY  07/2009;2014   benign polyps neg   COLONOSCOPY WITH PROPOFOL N/A 03/11/2018   repeat after 5 yrs due to polyp; Procedure: COLONOSCOPY WITH PROPOFOL;  Surgeon: Scot Jun, MD;  Location: [facility] ENDOSCOPY;  Service: Endoscopy;  Laterality: N/A;   DILATION AND CURETTAGE OF UTERUS     ESOPHAGOGASTRODUODENOSCOPY N/A 11/06/2014   Procedure: ESOPHAGOGASTRODUODENOSCOPY (EGD);  Surgeon: Elnita Maxwell, MD;  Location: [facility] ENDOSCOPY;  Service: Endoscopy;  Laterality: N/A;  With Small Bowel Biopsies   GANGLION CYST EXCISION Left     Family History  Problem Relation Age of Onset   Hypertension Mother    Diabetes Father    Heart disease Father    Hypertension Father    Breast cancer Cousin 70   Ovarian cancer Maternal Aunt 18   Ovarian cancer Cousin 101    Social History   Socioeconomic History   Marital status: Married    Spouse name: Not on file   Number of children: Not on file   Years of education: Not on file   Highest education level: Not on file  Occupational History   Not on file  Tobacco Use   Smoking status: Never   Smokeless tobacco: Never  Vaping Use   Vaping status: Never Used  Substance and  Sexual Activity   Alcohol use: Yes    Comment: occ   Drug use: No   Sexual activity: Yes    Birth control/protection: Post-menopausal  Other Topics Concern   Not on file  Social History Narrative   Not on file   Social Determinants of Health   Financial Resource Strain: Not on file  Food Insecurity: Not on file  Transportation Needs: Not on file  Physical Activity: Not on file  Stress: Not on file  Social Connections: Not on file  Intimate Partner Violence: Not on file     Current Outpatient Medications:    Cholecalciferol 50 MCG (2000 UT) CAPS, Take 1 capsule by mouth daily., Disp: , Rfl:    gabapentin (NEURONTIN) 400 MG capsule, Take 1 capsule (400 mg total) by mouth 3 (three) times daily., Disp: 60 capsule, Rfl: 3   glucose blood test strip, OneTouch Verio strips, Disp: , Rfl:    meloxicam (MOBIC) 15 MG tablet, Take 1 tablet (15 mg total) by mouth daily., Disp: 30 tablet, Rfl: 3   ONETOUCH DELICA LANCETS 33G MISC, OneTouch Delica Lancets 33 gauge, Disp: , Rfl:    pyridOXINE (VITAMIN B6) 25 MG tablet, Take by mouth., Disp: , Rfl:    tazarotene (TAZORAC) 0.1 % cream, Apply topically at bedtime. (Patient taking differently: Apply 1 application  topically at bedtime as needed.), Disp: 30 g, Rfl: 11   triamcinolone cream (KENALOG) 0.1 %, Apply 1 Application topically 2 (two) times daily as needed., Disp: , Rfl:    fluticasone (FLONASE) 50 MCG/ACT nasal spray, USE 2 SPRAYS IN EACH  NOSTRIL DAILY AS NEEDED FOR ALLERGIES OR RHINITIS, Disp: 48 g, Rfl: 3   levocetirizine (XYZAL) 5 MG tablet, Take 1 tablet (5 mg total) by mouth every evening., Disp: 90 tablet, Rfl: 3   lisinopril-hydrochlorothiazide (ZESTORETIC) 20-12.5 MG tablet, Take 1 tablet by mouth daily., Disp: 90 tablet, Rfl: 3   metFORMIN (GLUCOPHAGE) 500 MG tablet, Take 1 tablet (500 mg total) by mouth every evening., Disp: 90 tablet, Rfl: 1   pravastatin (PRAVACHOL) 20 MG tablet, Take 1 tablet (20 mg total) by mouth every  evening., Disp: 90 tablet, Rfl: 3   ROS:  Review of Systems  Constitutional:  Negative for fatigue, fever and unexpected weight change.  Respiratory:  Negative for cough, shortness of breath and wheezing.   Cardiovascular:  Negative for chest pain, palpitations and leg swelling.  Gastrointestinal:  Negative for abdominal pain, blood in stool, constipation, diarrhea, nausea and vomiting.  Endocrine: Negative for cold intolerance, heat intolerance and polyuria.  Genitourinary:  Negative for dyspareunia, dysuria, flank pain, frequency, genital sores, hematuria, menstrual problem, pelvic pain,  urgency, vaginal bleeding, vaginal discharge and vaginal pain.  Musculoskeletal:  Positive for arthralgias and joint swelling. Negative for back pain and myalgias.  Skin:  Negative for rash.  Neurological:  Negative for dizziness, syncope, light-headedness, numbness and headaches.  Hematological:  Negative for adenopathy.  Psychiatric/Behavioral:  Negative for agitation, confusion, sleep disturbance and suicidal ideas. The patient is not nervous/anxious.     Objective: BP 108/70   Ht 5\' 1"  (1.549 m)   Wt 177 lb (80.3 kg)   LMP 11/06/2014   BMI 33.44 kg/m    Physical Exam Constitutional:      Appearance: She is well-developed.  Genitourinary:     Vulva normal.     Right Labia: No rash, tenderness or lesions.    Left Labia: No tenderness, lesions or rash.    No vaginal discharge, erythema or tenderness.      Right Adnexa: not tender and no mass present.    Left Adnexa: not tender and no mass present.    No cervical motion tenderness, friability or polyp.     Uterus is not enlarged or tender.  Breasts:    Right: No mass, nipple discharge, skin change or tenderness.     Left: No mass, nipple discharge, skin change or tenderness.  Neck:     Thyroid: No thyromegaly.  Cardiovascular:     Rate and Rhythm: Normal rate and regular rhythm.     Heart sounds: Normal heart sounds. No murmur  heard. Pulmonary:     Effort: Pulmonary effort is normal.     Breath sounds: Normal breath sounds.  Abdominal:     Palpations: Abdomen is soft.     Tenderness: There is no abdominal tenderness. There is no guarding or rebound.  Musculoskeletal:        General: Normal range of motion.     Cervical back: Normal range of motion.  Lymphadenopathy:     Cervical: No cervical adenopathy.  Neurological:     General: No focal deficit present.     Mental Status: She is alert and oriented to person, place, and time.     Cranial Nerves: No cranial nerve deficit.  Skin:    General: Skin is warm and dry.  Psychiatric:        Mood and Affect: Mood normal.        Behavior: Behavior normal.        Thought Content: Thought content normal.        Judgment: Judgment normal.  Vitals reviewed.     Assessment/Plan:  Encounter for annual routine gynecological examination  Encounter for screening mammogram for malignant neoplasm of breast; pt has mammo appt  Essential hypertension - Plan: Comprehensive metabolic panel, lisinopril-hydrochlorothiazide (ZESTORETIC) 20-12.5 MG tablet; Rx RF. Doing well, no side effects.   Type 2 diabetes mellitus without complication, without long-term current use of insulin (HCC) - Plan: Hemoglobin A1c, lisinopril-hydrochlorothiazide (ZESTORETIC) 20-12.5 MG tablet, metFORMIN (GLUCOPHAGE) 500 MG tablet, pravastatin (PRAVACHOL) 20 MG tablet; Rx RF metformin, recheck in 6 months, orders placed.   Allergic rhinitis, unspecified seasonality, unspecified trigger - Plan: fluticasone (FLONASE) 50 MCG/ACT nasal spray, levocetirizine (XYZAL) 5 MG tablet; Rx RF to mail order  Mixed hyperlipidemia - Diet/exercise/wt loss - Plan. Rx RF eRxd, diet/exercise changes for wt loss.    Meds ordered this encounter  Medications   fluticasone (FLONASE) 50 MCG/ACT nasal spray    Sig: USE 2 SPRAYS IN EACH  NOSTRIL DAILY AS NEEDED FOR ALLERGIES OR RHINITIS    Dispense:  48 g    Refill:  3     Order Specific Question:   Supervising Provider    Answer:   Hildred Laser [[id number]]   levocetirizine (XYZAL) 5 MG tablet    Sig: Take 1 tablet (5 mg total) by mouth every evening.    Dispense:  90 tablet    Refill:  3    Order Specific Question:   Supervising Provider    Answer:   Hildred Laser [[id number]]   lisinopril-hydrochlorothiazide (ZESTORETIC) 20-12.5 MG tablet    Sig: Take 1 tablet by mouth daily.    Dispense:  90 tablet    Refill:  3    Order Specific Question:   Supervising Provider    Answer:   Hildred Laser [[id number]]   metFORMIN (GLUCOPHAGE) 500 MG tablet    Sig: Take 1 tablet (500 mg total) by mouth every evening.    Dispense:  90 tablet    Refill:  1    Order Specific Question:   Supervising Provider    Answer:   Hildred Laser [[id number]]   pravastatin (PRAVACHOL) 20 MG tablet    Sig: Take 1 tablet (20 mg total) by mouth every evening.    Dispense:  90 tablet    Refill:  3    Order Specific Question:   Supervising Provider    Answer:   Waymon Budge          GYN counsel mammography screening, adequate intake of calcium and vitamin D, diet and exercise    F/U  Return in about 1 year (around 02/22/2024).  Marylyn Appenzeller B. Dozier Berkovich, PA-C 02/22/2023 4:44 PM

## 2023-03-05 LAB — HM MAMMOGRAPHY

## 2023-03-06 ENCOUNTER — Encounter: Payer: Self-pay | Admitting: Obstetrics and Gynecology

## 2023-03-15 ENCOUNTER — Ambulatory Visit: Payer: No Typology Code available for payment source | Admitting: Dermatology

## 2023-04-02 ENCOUNTER — Ambulatory Visit: Payer: No Typology Code available for payment source | Admitting: Dermatology

## 2023-04-02 ENCOUNTER — Encounter: Payer: Self-pay | Admitting: Dermatology

## 2023-04-02 DIAGNOSIS — L578 Other skin changes due to chronic exposure to nonionizing radiation: Secondary | ICD-10-CM | POA: Diagnosis not present

## 2023-04-02 DIAGNOSIS — Z86018 Personal history of other benign neoplasm: Secondary | ICD-10-CM

## 2023-04-02 DIAGNOSIS — L820 Inflamed seborrheic keratosis: Secondary | ICD-10-CM | POA: Diagnosis not present

## 2023-04-02 DIAGNOSIS — Z79899 Other long term (current) drug therapy: Secondary | ICD-10-CM

## 2023-04-02 DIAGNOSIS — Z872 Personal history of diseases of the skin and subcutaneous tissue: Secondary | ICD-10-CM

## 2023-04-02 DIAGNOSIS — L409 Psoriasis, unspecified: Secondary | ICD-10-CM

## 2023-04-02 DIAGNOSIS — L814 Other melanin hyperpigmentation: Secondary | ICD-10-CM

## 2023-04-02 DIAGNOSIS — W908XXA Exposure to other nonionizing radiation, initial encounter: Secondary | ICD-10-CM | POA: Diagnosis not present

## 2023-04-02 DIAGNOSIS — D229 Melanocytic nevi, unspecified: Secondary | ICD-10-CM

## 2023-04-02 DIAGNOSIS — D1801 Hemangioma of skin and subcutaneous tissue: Secondary | ICD-10-CM

## 2023-04-02 DIAGNOSIS — Z1283 Encounter for screening for malignant neoplasm of skin: Secondary | ICD-10-CM

## 2023-04-02 DIAGNOSIS — Z7189 Other specified counseling: Secondary | ICD-10-CM

## 2023-04-02 DIAGNOSIS — L821 Other seborrheic keratosis: Secondary | ICD-10-CM

## 2023-04-02 MED ORDER — TAZAROTENE 0.1 % EX CREA
TOPICAL_CREAM | Freq: Every day | CUTANEOUS | 11 refills | Status: AC
Start: 2023-04-02 — End: ?

## 2023-04-02 NOTE — Progress Notes (Signed)
Follow-Up Visit   Subjective  Teresa Moran is a 57 y.o. female who presents for the following: Skin Cancer Screening and Full Body Skin Exam. Hx of dysplastic nevus. Hx of AKs.   Recently diagnosed with SIBO. Thinks could be associated with psoriasis. Uses Tazarotene cream on affected areas. Elbows.  No Hx of psoriatic arthritis. Does have osteoarthritis.  Family Hx of IBS (mother). No family Hx of psoriasis.   The patient presents for Total-Body Skin Exam (TBSE) for skin cancer screening and mole check. The patient has spots, moles and lesions to be evaluated, some may be new or changing and the patient may have concern these could be cancer.    The following portions of the chart were reviewed this encounter and updated as appropriate: medications, allergies, medical history  Review of Systems:  No other skin or systemic complaints except as noted in HPI or Assessment and Plan.  Objective  Well appearing patient in no apparent distress; mood and affect are within normal limits.  A full examination was performed including scalp, head, eyes, ears, nose, lips, neck, chest, axillae, abdomen, back, buttocks, bilateral upper extremities, bilateral lower extremities, hands, feet, fingers, toes, fingernails, and toenails. All findings within normal limits unless otherwise noted below.   Relevant physical exam findings are noted in the Assessment and Plan.  Right Superior Forehead x1 Erythematous keratotic or waxy stuck-on papule or plaque.    Assessment & Plan    HISTORY OF DYSPLASTIC NEVUS. Right mid back paraspinal. 02/2020. No evidence of recurrence today Recommend regular full body skin exams Recommend daily broad spectrum sunscreen SPF 30+ to sun-exposed areas, reapply every 2 hours as needed.  Call if any new or changing lesions are noted between office visits   SKIN CANCER SCREENING PERFORMED TODAY.  ACTINIC DAMAGE - Chronic condition, secondary to cumulative UV/sun  exposure - diffuse scaly erythematous macules with underlying dyspigmentation - Recommend daily broad spectrum sunscreen SPF 30+ to sun-exposed areas, reapply every 2 hours as needed.  - Staying in the shade or wearing long sleeves, sun glasses (UVA+UVB protection) and wide brim hats (4-inch brim around the entire circumference of the hat) are also recommended for sun protection.  - Call for new or changing lesions.  LENTIGINES, SEBORRHEIC KERATOSES, HEMANGIOMAS - Benign normal skin lesions - Benign-appearing - Call for any changes  MELANOCYTIC NEVI - Tan-brown and/or pink-flesh-colored symmetric macules and papules - Benign appearing on exam today - Observation - Call clinic for new or changing moles - Recommend daily use of broad spectrum spf 30+ sunscreen to sun-exposed areas.    PSORIASIS Exam: Well-demarcated erythematous papules/plaques with silvery scale, guttate pink scaly papules at elbows. 1% BSA. Chronic and persistent condition with duration or expected duration over one year. Condition is symptomatic/ bothersome to patient. Not currently at goal.  Patient C/O joint pain in hands and knees  Psoriasis is a chronic non-curable, but treatable genetic/hereditary disease that may have other systemic features affecting other organ systems such as joints (Psoriatic Arthritis). It is associated with an increased risk of inflammatory bowel disease, heart disease, non-alcoholic fatty liver disease, and depression.  Treatments include light and laser treatments; topical medications; and systemic medications including oral and injectables.  Treatment Plan: Continue topical Tazarotene for Psoriatic plaques.  Long term medication management.  Patient is using long term (months to years) prescription medication  to control their dermatologic condition.  These medications require periodic monitoring to evaluate for efficacy and side effects and may require periodic  laboratory  monitoring.    Inflamed seborrheic keratosis Right Superior Forehead x1  Symptomatic, irritating, patient would like treated.   Return to clinic in 6-8 weeks if not resolved.   Destruction of lesion - Right Superior Forehead x1 Complexity: simple   Destruction method: cryotherapy   Informed consent: discussed and consent obtained   Timeout:  patient name, date of birth, surgical site, and procedure verified Lesion destroyed using liquid nitrogen: Yes   Region frozen until ice ball extended beyond lesion: Yes   Outcome: patient tolerated procedure well with no complications   Post-procedure details: wound care instructions given   Additional details:  Prior to procedure, discussed risks of blister formation, small wound, skin dyspigmentation, or rare scar following cryotherapy. Recommend Vaseline ointment to treated areas while healing.   Psoriasis  Related Medications tazarotene (TAZORAC) 0.1 % cream Apply topically at bedtime.  Actinic skin damage  Skin cancer screening  Lentigo  Melanocytic nevus, unspecified location  Medication management  Counseling and coordination of care  History of dysplastic nevus   Return in about 1 year (around 04/01/2024) for TBSE, HxDN.  I, Lawson Radar, CMA, am acting as scribe for Armida Sans, MD.   Documentation: I have reviewed the above documentation for accuracy and completeness, and I agree with the above.  Armida Sans, MD

## 2023-04-02 NOTE — Patient Instructions (Addendum)
Cryotherapy Aftercare  Wash gently with soap and water everyday.   Apply Vaseline and Band-Aid daily until healed.   Send MyChart message if lesion on forehead has not resolved in 8 weeks.     Recommend daily broad spectrum sunscreen SPF 30+ to sun-exposed areas, reapply every 2 hours as needed. Call for new or changing lesions.  Staying in the shade or wearing long sleeves, sun glasses (UVA+UVB protection) and wide brim hats (4-inch brim around the entire circumference of the hat) are also recommended for sun protection.      Melanoma ABCDEs  Melanoma is the most dangerous type of skin cancer, and is the leading cause of death from skin disease.  You are more likely to develop melanoma if you: Have light-colored skin, light-colored eyes, or red or blond hair Spend a lot of time in the sun Tan regularly, either outdoors or in a tanning bed Have had blistering sunburns, especially during childhood Have a close family member who has had a melanoma Have atypical moles or large birthmarks  Early detection of melanoma is key since treatment is typically straightforward and cure rates are extremely high if we catch it early.   The first sign of melanoma is often a change in a mole or a new dark spot.  The ABCDE system is a way of remembering the signs of melanoma.  A for asymmetry:  The two halves do not match. B for border:  The edges of the growth are irregular. C for color:  A mixture of colors are present instead of an even brown color. D for diameter:  Melanomas are usually (but not always) greater than 6mm - the size of a pencil eraser. E for evolution:  The spot keeps changing in size, shape, and color.  Please check your skin once per month between visits. You can use a small mirror in front and a large mirror behind you to keep an eye on the back side or your body.   If you see any new or changing lesions before your next follow-up, please call to schedule a visit.  Please  continue daily skin protection including broad spectrum sunscreen SPF 30+ to sun-exposed areas, reapplying every 2 hours as needed when you're outdoors.   Staying in the shade or wearing long sleeves, sun glasses (UVA+UVB protection) and wide brim hats (4-inch brim around the entire circumference of the hat) are also recommended for sun protection.       Due to recent changes in healthcare laws, you may see results of your pathology and/or laboratory studies on MyChart before the doctors have had a chance to review them. We understand that in some cases there may be results that are confusing or concerning to you. Please understand that not all results are received at the same time and often the doctors may need to interpret multiple results in order to provide you with the best plan of care or course of treatment. Therefore, we ask that you please give [place] 2 business days to thoroughly review all your results before contacting the office for clarification. Should we see a critical lab result, you will be contacted sooner.   If You Need Anything After Your Visit  If you have any questions or concerns for your doctor, please call our main line at [phone] and press option 4 to reach your doctor's medical assistant. If no one answers, please leave a voicemail as directed and we will return your call as soon as possible. Messages  left after 4 pm will be answered the following business day.   You may also send [place] a message via MyChart. We typically respond to MyChart messages within 1-2 business days.  For prescription refills, please ask your pharmacy to contact our office. Our fax number is [phone].  If you have an urgent issue when the clinic is closed that cannot wait until the next business day, you can page your doctor at the number below.    Please note that while we do our best to be available for urgent issues outside of office hours, we are not available 24/7.   If you have an  urgent issue and are unable to reach [place], you may choose to seek medical care at your doctor's office, retail clinic, urgent care center, or emergency room.  If you have a medical emergency, please immediately call 911 or go to the emergency department.  Pager Numbers  - Dr. Gwen Pounds: [phone]  - Dr. Roseanne Reno: [phone]  - Dr. Katrinka Blazing: [phone]   In the event of inclement weather, please call our main line at [phone] for an update on the status of any delays or closures.  Dermatology Medication Tips: Please keep the boxes that topical medications come in in order to help keep track of the instructions about where and how to use these. Pharmacies typically print the medication instructions only on the boxes and not directly on the medication tubes.   If your medication is too expensive, please contact our office at [phone] option 4 or send [place] a message through MyChart.   We are unable to tell what your co-pay for medications will be in advance as this is different depending on your insurance coverage. However, we may be able to find a substitute medication at lower cost or fill out paperwork to get insurance to cover a needed medication.   If a prior authorization is required to get your medication covered by your insurance company, please allow [place] 1-2 business days to complete this process.  Drug prices often vary depending on where the prescription is filled and some pharmacies may offer cheaper prices.  The website www.goodrx.com contains coupons for medications through different pharmacies. The prices here do not account for what the cost may be with help from insurance (it may be cheaper with your insurance), but the website can give you the price if you did not use any insurance.  - You can print the associated coupon and take it with your prescription to the pharmacy.  - You may also stop by our office during regular business hours and pick up a GoodRx coupon card.   - If you need your prescription sent electronically to a different pharmacy, notify our office through [facility] or by phone at [phone] option 4.     Si Usted Necesita Algo Despus de Su Visita  Tambin puede enviarnos un mensaje a travs de Clinical cytogeneticist. Por lo general respondemos a los mensajes de MyChart en el transcurso de 1 a 2 das hbiles.  Para renovar recetas, por favor pida a su farmacia que se ponga en contacto con nuestra oficina. Annie Sable de fax es [place] [phone].  Si tiene un asunto urgente cuando la clnica est cerrada y que no puede esperar hasta el siguiente da hbil, puede llamar/localizar a su doctor(a) al nmero que aparece a continuacin.   Por favor, tenga en cuenta que aunque hacemos todo lo posible para estar disponibles para asuntos urgentes fuera del horario de [place], no  estamos disponibles las 24 horas del da, los 7 [address]  [address] de la [place].   Si tiene un problema urgente y no puede comunicarse con nosotros, puede optar por buscar atencin mdica  en el consultorio de su doctor(a), en una clnica privada, en un centro de atencin urgente o en una sala de emergencias.  Si tiene Engineer, drilling, por favor llame inmediatamente al 911 o vaya a la sala de emergencias.  Nmeros de bper  - Dr. Gwen Pounds: [phone]  - Dra. Roseanne Reno: [phone]  - Dr. Katrinka Blazing: [phone]   En caso de inclemencias del tiempo, por favor llame a Lacy Duverney principal al [phone] para una actualizacin sobre el [place] de cualquier retraso o cierre.  Consejos para la medicacin en dermatologa: Por favor, guarde las cajas en las que vienen los medicamentos de uso tpico para ayudarle a seguir las instrucciones sobre dnde y cmo usarlos. Las farmacias generalmente imprimen las instrucciones del medicamento slo en las cajas y no directamente en los tubos del [place].   Si su medicamento es muy caro, por favor, pngase en contacto con Rolm Gala  llamando al [phone] y presione la opcin 4 o envenos un mensaje a travs de Clinical cytogeneticist.   No podemos decirle cul ser su copago por los medicamentos por adelantado ya que esto es diferente dependiendo de la cobertura de su seguro. Sin embargo, es posible que podamos encontrar un medicamento sustituto a Audiological scientist un formulario para que el seguro cubra el medicamento que se considera necesario.   Si se requiere una autorizacin previa para que su compaa de seguros [place] su medicamento, por favor permtanos de 1 a 2 das hbiles para completar [address]  Los precios de los medicamentos varan con frecuencia dependiendo del Environmental consultant de dnde se surte la receta y alguna farmacias pueden ofrecer precios ms baratos.  El sitio web www.goodrx.com tiene cupones para medicamentos de Health and safety inspector. Los precios aqu no tienen en cuenta lo que podra costar con la ayuda del seguro (puede ser ms barato con su seguro), pero el sitio web puede darle el precio si no utiliz Tourist information centre manager.  - Puede imprimir el cupn correspondiente y llevarlo con su receta a la farmacia.  - Tambin puede pasar por nuestra oficina durante el horario de atencin regular y Education officer, museum una tarjeta de cupones de GoodRx.  - Si necesita que su receta se enve electrnicamente a una farmacia diferente, informe a nuestra oficina a travs de MyChart de [place] o por telfono llamando al [phone] y presione la opcin 4.

## 2023-04-04 ENCOUNTER — Encounter: Payer: Self-pay | Admitting: Obstetrics and Gynecology

## 2023-05-22 ENCOUNTER — Encounter: Payer: Self-pay | Admitting: Obstetrics and Gynecology

## 2023-05-23 ENCOUNTER — Other Ambulatory Visit: Payer: Self-pay | Admitting: Obstetrics and Gynecology

## 2023-05-23 DIAGNOSIS — N3000 Acute cystitis without hematuria: Secondary | ICD-10-CM

## 2023-05-23 MED ORDER — CIPROFLOXACIN HCL 500 MG PO TABS
500.0000 mg | ORAL_TABLET | Freq: Two times a day (BID) | ORAL | 0 refills | Status: AC
Start: 2023-05-23 — End: 2023-05-28

## 2023-05-23 NOTE — Progress Notes (Signed)
Rx cipro for mission trip to [place]

## 2023-06-21 ENCOUNTER — Encounter: Payer: Self-pay | Admitting: *Deleted

## 2023-06-29 ENCOUNTER — Ambulatory Visit: Payer: No Typology Code available for payment source | Admitting: Anesthesiology

## 2023-06-29 ENCOUNTER — Encounter
Admission: RE | Disposition: A | Discharge status: Home / Self Care | Payer: Self-pay | Source: Ambulatory Visit | Attending: Gastroenterology

## 2023-06-29 ENCOUNTER — Encounter: Payer: Self-pay | Admitting: *Deleted

## 2023-06-29 ENCOUNTER — Ambulatory Visit
Admission: RE | Admit: 2023-06-29 | Discharge: 2023-06-29 | Disposition: A | Discharge status: Home / Self Care | Payer: No Typology Code available for payment source | Source: Ambulatory Visit | Attending: Gastroenterology | Admitting: Gastroenterology

## 2023-06-29 DIAGNOSIS — Z6831 Body mass index (BMI) 31.0-31.9, adult: Secondary | ICD-10-CM | POA: Insufficient documentation

## 2023-06-29 DIAGNOSIS — K640 First degree hemorrhoids: Secondary | ICD-10-CM | POA: Insufficient documentation

## 2023-06-29 DIAGNOSIS — E782 Mixed hyperlipidemia: Secondary | ICD-10-CM | POA: Insufficient documentation

## 2023-06-29 DIAGNOSIS — E119 Type 2 diabetes mellitus without complications: Secondary | ICD-10-CM | POA: Insufficient documentation

## 2023-06-29 DIAGNOSIS — I10 Essential (primary) hypertension: Secondary | ICD-10-CM | POA: Insufficient documentation

## 2023-06-29 DIAGNOSIS — G4733 Obstructive sleep apnea (adult) (pediatric): Secondary | ICD-10-CM | POA: Diagnosis not present

## 2023-06-29 DIAGNOSIS — Z1211 Encounter for screening for malignant neoplasm of colon: Secondary | ICD-10-CM | POA: Insufficient documentation

## 2023-06-29 DIAGNOSIS — K635 Polyp of colon: Secondary | ICD-10-CM | POA: Insufficient documentation

## 2023-06-29 DIAGNOSIS — Z860101 Personal history of adenomatous and serrated colon polyps: Secondary | ICD-10-CM | POA: Diagnosis present

## 2023-06-29 DIAGNOSIS — K5730 Diverticulosis of large intestine without perforation or abscess without bleeding: Secondary | ICD-10-CM | POA: Diagnosis not present

## 2023-06-29 DIAGNOSIS — Z7984 Long term (current) use of oral hypoglycemic drugs: Secondary | ICD-10-CM | POA: Diagnosis not present

## 2023-06-29 DIAGNOSIS — Z79899 Other long term (current) drug therapy: Secondary | ICD-10-CM | POA: Diagnosis not present

## 2023-06-29 DIAGNOSIS — E66813 Obesity, class 3: Secondary | ICD-10-CM | POA: Insufficient documentation

## 2023-06-29 DIAGNOSIS — D123 Benign neoplasm of transverse colon: Secondary | ICD-10-CM | POA: Insufficient documentation

## 2023-06-29 DIAGNOSIS — M1990 Unspecified osteoarthritis, unspecified site: Secondary | ICD-10-CM | POA: Diagnosis not present

## 2023-06-29 HISTORY — PX: COLONOSCOPY WITH PROPOFOL: SHX5780

## 2023-06-29 HISTORY — PX: POLYPECTOMY: SHX5525

## 2023-06-29 LAB — GLUCOSE, CAPILLARY: Glucose-Capillary: 99 mg/dL (ref 70–99)

## 2023-06-29 SURGERY — COLONOSCOPY WITH PROPOFOL
Anesthesia: General

## 2023-06-29 MED ORDER — EPHEDRINE SULFATE-NACL 50-0.9 MG/10ML-% IV SOSY
PREFILLED_SYRINGE | INTRAVENOUS | Status: DC | PRN
  Administered 2023-06-29: 15 mg via INTRAVENOUS
  Administered 2023-06-29: 10 mg via INTRAVENOUS

## 2023-06-29 MED ORDER — EPHEDRINE 5 MG/ML INJ
INTRAVENOUS | Status: AC
  Filled 2023-06-29: qty 5

## 2023-06-29 MED ORDER — PROPOFOL 10 MG/ML IV BOLUS
INTRAVENOUS | Status: DC | PRN
  Administered 2023-06-29: 50 mg via INTRAVENOUS
  Administered 2023-06-29: 20 mg via INTRAVENOUS

## 2023-06-29 MED ORDER — PROPOFOL 500 MG/50ML IV EMUL
INTRAVENOUS | Status: DC | PRN
  Administered 2023-06-29: 75 ug/kg/min via INTRAVENOUS

## 2023-06-29 MED ORDER — SODIUM CHLORIDE 0.9 % IV SOLN: INTRAVENOUS | Status: DC | PRN

## 2023-06-29 MED ORDER — LIDOCAINE HCL (CARDIAC) PF 100 MG/5ML IV SOSY
PREFILLED_SYRINGE | INTRAVENOUS | Status: DC | PRN
  Administered 2023-06-29: 60 mg via INTRAVENOUS

## 2023-06-29 MED ORDER — LIDOCAINE HCL (PF) 2 % IJ SOLN
INTRAMUSCULAR | Status: AC
  Filled 2023-06-29: qty 5

## 2023-06-29 MED ORDER — PROPOFOL 1000 MG/100ML IV EMUL
INTRAVENOUS | Status: AC
  Filled 2023-06-29: qty 100

## 2023-06-29 MED ORDER — DEXMEDETOMIDINE HCL IN NACL 80 MCG/20ML IV SOLN
INTRAVENOUS | Status: AC
  Filled 2023-06-29: qty 20

## 2023-06-29 MED ORDER — DEXMEDETOMIDINE HCL IN NACL 80 MCG/20ML IV SOLN
INTRAVENOUS | Status: DC | PRN
Start: 2023-06-29 — End: 2023-06-29
  Administered 2023-06-29: 20 ug via INTRAVENOUS

## 2023-06-29 NOTE — Transfer of Care (Signed)
Immediate Anesthesia Transfer of Care Note  Patient: Teresa Moran  Procedure(s) Performed: COLONOSCOPY WITH PROPOFOL POLYPECTOMY  Patient Location: PACU  Anesthesia Type:General  Level of Consciousness: sedated  Airway & Oxygen Therapy: Patient Spontanous Breathing and Patient connected to nasal cannula oxygen  Post-op Assessment: Report given to RN and Post -op Vital signs reviewed and stable  Post vital signs: Reviewed and stable  Last Vitals:  Vitals Value Taken Time  BP 87/56 06/29/23 0839  Temp    Pulse 93 06/29/23 0839  Resp 17 06/29/23 0839  SpO2 98 % 06/29/23 0839  Vitals shown include unfiled device data.  Last Pain:  Vitals:   06/29/23 0740  TempSrc: Temporal  PainSc: 0-No pain         Complications: No notable events documented.

## 2023-06-29 NOTE — Anesthesia Postprocedure Evaluation (Signed)
Anesthesia Post Note  Patient: Teresa Moran  Procedure(s) Performed: COLONOSCOPY WITH PROPOFOL POLYPECTOMY  Patient location during evaluation: PACU Anesthesia Type: General Level of consciousness: awake and awake and alert Pain management: satisfactory to patient Respiratory status: spontaneous breathing Cardiovascular status: blood pressure returned to baseline Anesthetic complications: no   No notable events documented.   Last Vitals:  Vitals:   06/29/23 0842 06/29/23 0849  BP: 91/63 103/61  Pulse:    Resp:    Temp:    SpO2:      Last Pain:  Vitals:   06/29/23 0849  TempSrc:   PainSc: 0-No pain                 VAN STAVEREN,Biagio Snelson

## 2023-06-29 NOTE — Interval H&P Note (Signed)
History and Physical Interval Note:  06/29/2023 8:12 AM  Teresa Moran  has presented today for surgery, with the diagnosis of IRREGULAR BOWEL HABITS,HX OF ADENOMATOUS POLYP OF COLON.  The various methods of treatment have been discussed with the patient and family. After consideration of risks, benefits and other options for treatment, the patient has consented to  Procedure(s): COLONOSCOPY WITH PROPOFOL (N/A) as a surgical intervention.  The patient's history has been reviewed, patient examined, no change in status, stable for surgery.  I have reviewed the patient's chart and labs.  Questions were answered to the patient's satisfaction.     Regis Bill  Ok to proceed with colonoscopy

## 2023-06-29 NOTE — Anesthesia Preprocedure Evaluation (Signed)
Anesthesia Evaluation  Patient identified by MRN, date of birth, ID band Patient awake    Reviewed: Allergy & Precautions, NPO status , Patient's Chart, lab work & pertinent test results  History of Anesthesia Complications (+) PONV and history of anesthetic complications  Airway Mallampati: III  TM Distance: >3 FB Neck ROM: Full    Dental  (+) Teeth Intact   Pulmonary neg pulmonary ROS, sleep apnea    Pulmonary exam normal breath sounds clear to auscultation       Cardiovascular Exercise Tolerance: Good hypertension, Pt. on medications negative cardio ROS Normal cardiovascular exam Rhythm:Regular     Neuro/Psych negative neurological ROS  negative psych ROS   GI/Hepatic negative GI ROS, Neg liver ROS,,,  Endo/Other  negative endocrine ROSdiabetes, Type 2, Oral Hypoglycemic Agents  Class 3 obesity  Renal/GU negative Renal ROS  negative genitourinary   Musculoskeletal  (+) Arthritis ,    Abdominal  (+) + obese  Peds negative pediatric ROS (+)  Hematology negative hematology ROS (+)   Anesthesia Other Findings Past Medical History: 2012: Abnormal mammogram     Comment:  resolved No date: Arthritis 02/2020: Atypical mole     Comment:  right mid back paraspinal No date: Complication of anesthesia No date: Diabetes mellitus without complication (HCC) No date: Family history of adverse reaction to anesthesia     Comment:  mom-hard to wake up, n/v No date: Family history of ovarian cancer No date: Fundic gland polyps of stomach, benign No date: Glaucoma 01/27/2016: History of mammogram     Comment:  Birads 1 01/17/2016: History of Papanicolaou smear of cervix     Comment:  NIL/NEG No date: Hypertension No date: IBS (irritable bowel syndrome) No date: Mixed hyperlipidemia No date: Osteoarthritis No date: PONV (postoperative nausea and vomiting)     Comment:  n/v No date: Rhinitis, allergic No date: Sleep  apnea     Comment:  does not use CPAP 02/2016: Testing of female for genetic disease carrier status     Comment:  VISTASEQ Neg No date: Torn meniscus     Comment:  RIGHT KNEE-PT STATES IT HAS HEALED BUT STILL HAS               WEAKNESS AND PAIN IN KNEE No date: Type 2 diabetes mellitus (HCC)  Past Surgical History: 11/23/2022: CARPAL TUNNEL RELEASE; Left     Comment:  Procedure: CARPAL TUNNEL RELEASE;  Surgeon: Deeann Saint, MD;  Location: [facility] ORS;  Service: Orthopedics;                Laterality: Left; 07/2009;2014: COLONOSCOPY     Comment:  benign polyps neg 03/11/2018: COLONOSCOPY WITH PROPOFOL; N/A     Comment:  repeat after 5 yrs due to polyp; Procedure: COLONOSCOPY               WITH PROPOFOL;  Surgeon: Scot Jun, MD;                Location: [facility] ENDOSCOPY;  Service: Endoscopy;                Laterality: N/A; No date: DILATION AND CURETTAGE OF UTERUS 11/06/2014: ESOPHAGOGASTRODUODENOSCOPY; N/A     Comment:  Procedure: ESOPHAGOGASTRODUODENOSCOPY (EGD);  Surgeon:               Elnita Maxwell, MD;  Location[facility];  Service: Endoscopy;  Laterality: N/A;  With Small Bowel               Biopsies No date: GANGLION CYST EXCISION; Left  BMI    Body Mass Index: 31.81 kg/m      Reproductive/Obstetrics negative OB ROS                             Anesthesia Physical Anesthesia Plan  ASA: 3  Anesthesia Plan: General   Post-op Pain Management:    Induction: Intravenous  PONV Risk Score and Plan: Propofol infusion and TIVA  Airway Management Planned: Natural Airway and Nasal Cannula  Additional Equipment:   Intra-op Plan:   Post-operative Plan:   Informed Consent: I have reviewed the patients History and Physical, chart, labs and discussed the procedure including the risks, benefits and alternatives for the proposed anesthesia with the patient or authorized representative who has indicated  his/her understanding and acceptance.     Dental Advisory Given  Plan Discussed with: CRNA  Anesthesia Plan Comments:        Anesthesia Quick Evaluation

## 2023-06-29 NOTE — Op Note (Signed)
[facility] Patient Name: Teresa Moran Procedure Date: 06/29/2023 7:10 AM MRN: [id number] Account #: [id number] Date of Birth: [birth year] Admit Type: Outpatient Age: 58 Room: [facility] ENDO ROOM 3 Gender: Female Note Status: Finalized Instrument Name: Prentice Docker [id number] Procedure:             Colonoscopy Indications:           High risk colon cancer surveillance: Personal history                         of sessile serrated colon polyp (less than 10 mm in                         size) with no dysplasia, Last colonoscopy 5 years ago Providers:             Eather Colas MD, MD Referring MD:          Levin Erp Medicines:             Monitored Anesthesia Care Complications:         No immediate complications. Estimated blood loss:                         Minimal. Procedure:             Pre-Anesthesia Assessment:                        - Prior to the procedure, a History and Physical was                         performed, and patient medications and allergies were                         reviewed. The patient is competent. The risks and                         benefits of the procedure and the sedation options and                         risks were discussed with the patient. All questions                         were answered and informed consent was obtained.                         Patient identification and proposed procedure were                         verified by the physician, the nurse, the                         anesthesiologist, the anesthetist and the technician                         in the endoscopy suite. Mental Status Examination:                         alert and oriented. Airway Examination: normal  oropharyngeal airway and neck mobility. Respiratory                         Examination: clear to auscultation. CV Examination:                         normal. Prophylactic Antibiotics: The patient does not                          require prophylactic antibiotics. Prior                         Anticoagulants: The patient has taken no anticoagulant                         or antiplatelet agents. ASA Grade Assessment: III - A                         patient with severe systemic disease. After reviewing                         the risks and benefits, the patient was deemed in                         satisfactory condition to undergo the procedure. The                         anesthesia plan was to use monitored anesthesia care                         (MAC). Immediately prior to administration of                         medications, the patient was re-assessed for adequacy                         to receive sedatives. The heart rate, respiratory                         rate, oxygen saturations, blood pressure, adequacy of                         pulmonary ventilation, and response to care were                         monitored throughout the procedure. The physical                         status of the patient was re-assessed after the                         procedure.                        After obtaining informed consent, the colonoscope was                         passed under direct vision. Throughout the procedure,  the patient's blood pressure, pulse, and oxygen                         saturations were monitored continuously. The                         Colonoscope was introduced through the anus and                         advanced to the the cecum, identified by appendiceal                         orifice and ileocecal valve. The colonoscopy was                         performed without difficulty. The patient tolerated                         the procedure well. The quality of the bowel                         preparation was good. The ileocecal valve, appendiceal                         orifice, and rectum were photographed. Findings:      The perianal and digital  rectal examinations were normal.      Two sessile polyps were found in the transverse colon. The polyps were 1       to 2 mm in size. These polyps were removed with a jumbo cold forceps.       Resection and retrieval were complete. Estimated blood loss was minimal.      A 3 mm polyp was found in the transverse colon. The polyp was sessile.       The polyp was removed with a cold snare. Resection and retrieval were       complete. Estimated blood loss was minimal.      A few small-mouthed diverticula were found in the sigmoid colon.      Internal hemorrhoids were found during retroflexion. The hemorrhoids       were Grade I (internal hemorrhoids that do not prolapse).      The exam was otherwise without abnormality on direct and retroflexion       views. Impression:            - Two 1 to 2 mm polyps in the transverse colon,                         removed with a jumbo cold forceps. Resected and                         retrieved.                        - One 3 mm polyp in the transverse colon, removed with                         a cold snare. Resected and retrieved.                        -  Diverticulosis in the sigmoid colon.                        - Internal hemorrhoids.                        - The examination was otherwise normal on direct and                         retroflexion views. Recommendation:        - Discharge patient to home.                        - Resume previous diet.                        - Continue present medications.                        - Await pathology results.                        - Repeat colonoscopy in 5 years for surveillance.                        - Return to referring physician as previously                         scheduled. Procedure Code(s):     --- Professional ---                        [phone], Colonoscopy, flexible; with removal of                         tumor(s), polyp(s), or other lesion(s) by snare                         technique                         [id number], 58, Colonoscopy, flexible; with biopsy, single                         or multiple Diagnosis Code(s):     --- Professional ---                        D12.3, Benign neoplasm of transverse colon (hepatic                         flexure or splenic flexure)                        Z86.010, Personal history of colonic polyps                        K64.0, First degree hemorrhoids                        K57.30, Diverticulosis of large intestine without                         perforation or abscess without  bleeding CPT copyright 2022 [employer]. All rights reserved. The codes documented in this report are preliminary and upon coder review may  be revised to meet current compliance requirements. Eather Colas MD, MD 06/29/2023 8:44:53 AM Number of Addenda: 0 Note Initiated On: 06/29/2023 7:10 AM Scope Withdrawal Time: 0 hours 9 minutes 26 seconds  Total Procedure Duration: 0 hours 16 minutes 36 seconds  Estimated Blood Loss:  Estimated blood loss was minimal.      [facility]

## 2023-06-29 NOTE — H&P (Signed)
Outpatient short stay form Pre-procedure 06/29/2023  Regis Bill, MD  Primary Physician: Rica Records, PA-C  Reason for visit:  Surveillance  History of present illness:    58 y/o lady with history of DM II, hypertension, OSA, and HLD here for surveillance colonoscopy. Last colonoscopy in 2019 with small SSA. No blood thinners. No family history of GI malignancies. No significant abdominal surgeries.   No current facility-administered medications for this encounter.  Medications Prior to Admission  Medication Sig Dispense Refill Last Dose/Taking   Cholecalciferol 50 MCG (2000 UT) CAPS Take 1 capsule by mouth daily.   Past Week   fluticasone (FLONASE) 50 MCG/ACT nasal spray USE 2 SPRAYS IN EACH  NOSTRIL DAILY AS NEEDED FOR ALLERGIES OR RHINITIS 48 g 3 Past Week   gabapentin (NEURONTIN) 400 MG capsule Take 1 capsule (400 mg total) by mouth 3 (three) times daily. 60 capsule 3 Past Week   glucose blood test strip OneTouch Verio strips   06/28/2023   levocetirizine (XYZAL) 5 MG tablet Take 1 tablet (5 mg total) by mouth every evening. 90 tablet 3 06/28/2023 Morning   lisinopril-hydrochlorothiazide (ZESTORETIC) 20-12.5 MG tablet Take 1 tablet by mouth daily. 90 tablet 3 06/28/2023 Morning   meloxicam (MOBIC) 15 MG tablet Take 1 tablet (15 mg total) by mouth daily. 30 tablet 3 Past Week   metFORMIN (GLUCOPHAGE) 500 MG tablet Take 1 tablet (500 mg total) by mouth every evening. 90 tablet 1 Past Week   ONETOUCH DELICA LANCETS 33G MISC OneTouch Delica Lancets 33 gauge   06/28/2023   pravastatin (PRAVACHOL) 20 MG tablet Take 1 tablet (20 mg total) by mouth every evening. 90 tablet 3 06/28/2023 Evening   pyridOXINE (VITAMIN B6) 25 MG tablet Take by mouth.   Past Week   tazarotene (TAZORAC) 0.1 % cream Apply topically at bedtime. 30 g 11 Past Week   triamcinolone cream (KENALOG) 0.1 % Apply 1 Application topically 2 (two) times daily as needed.   Past Week     No Known Allergies   Past  Medical History:  Diagnosis Date   Abnormal mammogram 2012   resolved   Arthritis    Atypical mole 02/2020   right mid back paraspinal   Complication of anesthesia    Diabetes mellitus without complication (HCC)    Family history of adverse reaction to anesthesia    mom-hard to wake up, n/v   Family history of ovarian cancer    Fundic gland polyps of stomach, benign    Glaucoma    History of mammogram 01/27/2016   Birads 1   History of Papanicolaou smear of cervix 01/17/2016   NIL/NEG   Hypertension    IBS (irritable bowel syndrome)    Mixed hyperlipidemia    Osteoarthritis    PONV (postoperative nausea and vomiting)    n/v   Rhinitis, allergic    Sleep apnea    does not use CPAP   Testing of female for genetic disease carrier status 02/2016   VISTASEQ Neg   Torn meniscus    RIGHT KNEE-PT STATES IT HAS HEALED BUT STILL HAS WEAKNESS AND PAIN IN KNEE   Type 2 diabetes mellitus (HCC)     Review of systems:  Otherwise negative.    Physical Exam  Gen: Alert, oriented. Appears stated age.  HEENT: PERRLA. Lungs: No respiratory distress CV: RRR Abd: soft, benign, no masses Ext: No edema    Planned procedures: Proceed with colonoscopy. The patient understands the nature of the planned  procedure, indications, risks, alternatives and potential complications including but not limited to bleeding, infection, perforation, damage to internal organs and possible oversedation/side effects from anesthesia. The patient agrees and gives consent to proceed.  Please refer to procedure notes for findings, recommendations and patient disposition/instructions.     Regis Bill, MD [facility]

## 2023-07-02 ENCOUNTER — Encounter: Payer: Self-pay | Admitting: Gastroenterology

## 2023-07-02 LAB — SURGICAL PATHOLOGY

## 2023-07-09 ENCOUNTER — Other Ambulatory Visit: Payer: Self-pay | Admitting: Obstetrics and Gynecology

## 2023-07-09 DIAGNOSIS — E119 Type 2 diabetes mellitus without complications: Secondary | ICD-10-CM

## 2023-07-12 ENCOUNTER — Other Ambulatory Visit: Payer: No Typology Code available for payment source

## 2023-07-12 DIAGNOSIS — I10 Essential (primary) hypertension: Secondary | ICD-10-CM

## 2023-07-12 DIAGNOSIS — E119 Type 2 diabetes mellitus without complications: Secondary | ICD-10-CM

## 2023-07-13 ENCOUNTER — Encounter: Payer: Self-pay | Admitting: Obstetrics and Gynecology

## 2023-07-13 LAB — COMPREHENSIVE METABOLIC PANEL
ALT: 17 [IU]/L (ref 0–32)
AST: 16 [IU]/L (ref 0–40)
Albumin: 4.2 g/dL (ref 3.8–4.9)
Alkaline Phosphatase: 56 [IU]/L (ref 44–121)
BUN/Creatinine Ratio: 13 (ref 9–23)
BUN: 11 mg/dL (ref 6–24)
Bilirubin Total: 0.5 mg/dL (ref 0.0–1.2)
CO2: 27 mmol/L (ref 20–29)
Calcium: 9.3 mg/dL (ref 8.7–10.2)
Chloride: 104 mmol/L (ref 96–106)
Creatinine, Ser: 0.85 mg/dL (ref 0.57–1.00)
Globulin, Total: 1.8 g/dL (ref 1.5–4.5)
Glucose: 121 mg/dL — ABNORMAL HIGH (ref 70–99)
Potassium: 4.1 mmol/L (ref 3.5–5.2)
Sodium: 143 mmol/L (ref 134–144)
Total Protein: 6 g/dL (ref 6.0–8.5)
eGFR: 79 mL/min/{1.73_m2} (ref >59)

## 2023-07-13 LAB — HEMOGLOBIN A1C
Est. average glucose Bld gHb Est-mCnc: 140 mg/dL
Hgb A1c MFr Bld: 6.5 % — ABNORMAL HIGH (ref 4.8–5.6)

## 2023-07-14 ENCOUNTER — Other Ambulatory Visit: Payer: Self-pay | Admitting: Obstetrics and Gynecology

## 2023-07-14 DIAGNOSIS — E119 Type 2 diabetes mellitus without complications: Secondary | ICD-10-CM

## 2023-07-14 MED ORDER — METFORMIN HCL 500 MG PO TABS: 500.0000 mg | ORAL_TABLET | Freq: Every evening | ORAL | 1 refills | Status: DC

## 2023-07-14 NOTE — Progress Notes (Signed)
 Rx RF metformin  to [facility] till 9/25 annual.

## 2023-11-28 ENCOUNTER — Encounter: Payer: Self-pay | Admitting: Dermatology

## 2024-02-25 ENCOUNTER — Encounter: Payer: Self-pay | Admitting: Obstetrics and Gynecology

## 2024-03-05 LAB — HM MAMMOGRAPHY

## 2024-03-06 ENCOUNTER — Encounter: Payer: Self-pay | Admitting: Obstetrics and Gynecology

## 2024-03-31 ENCOUNTER — Ambulatory Visit: Admitting: Dermatology

## 2024-03-31 DIAGNOSIS — W908XXA Exposure to other nonionizing radiation, initial encounter: Secondary | ICD-10-CM | POA: Diagnosis not present

## 2024-03-31 DIAGNOSIS — Z1283 Encounter for screening for malignant neoplasm of skin: Secondary | ICD-10-CM | POA: Diagnosis not present

## 2024-03-31 DIAGNOSIS — L409 Psoriasis, unspecified: Secondary | ICD-10-CM

## 2024-03-31 DIAGNOSIS — L821 Other seborrheic keratosis: Secondary | ICD-10-CM

## 2024-03-31 DIAGNOSIS — D229 Melanocytic nevi, unspecified: Secondary | ICD-10-CM

## 2024-03-31 DIAGNOSIS — D1801 Hemangioma of skin and subcutaneous tissue: Secondary | ICD-10-CM

## 2024-03-31 DIAGNOSIS — L814 Other melanin hyperpigmentation: Secondary | ICD-10-CM

## 2024-03-31 DIAGNOSIS — Z7189 Other specified counseling: Secondary | ICD-10-CM

## 2024-03-31 DIAGNOSIS — L578 Other skin changes due to chronic exposure to nonionizing radiation: Secondary | ICD-10-CM | POA: Diagnosis not present

## 2024-03-31 DIAGNOSIS — Z79899 Other long term (current) drug therapy: Secondary | ICD-10-CM

## 2024-03-31 DIAGNOSIS — Z86018 Personal history of other benign neoplasm: Secondary | ICD-10-CM

## 2024-03-31 MED ORDER — MOMETASONE FUROATE 0.1 % EX CREA: TOPICAL_CREAM | CUTANEOUS | 11 refills | Status: AC

## 2024-03-31 NOTE — Progress Notes (Signed)
   Follow-Up Visit   Subjective  Teresa Moran is a 58 y.o. female who presents for the following: Skin Cancer Screening and Full Body Skin Exam, hx of Dysplastic nevus.   The patient presents for Total-Body Skin Exam (TBSE) for skin cancer screening and mole check. The patient has spots, moles and lesions to be evaluated, some may be new or changing and the patient may have concern these could be cancer.    The following portions of the chart were reviewed this encounter and updated as appropriate: medications, allergies, medical history  Review of Systems:  No other skin or systemic complaints except as noted in HPI or Assessment and Plan.  Objective  Well appearing patient in no apparent distress; mood and affect are within normal limits.  A full examination was performed including scalp, head, eyes, ears, nose, lips, neck, chest, axillae, abdomen, back, buttocks, bilateral upper extremities, bilateral lower extremities, hands, feet, fingers, toes, fingernails, and toenails. All findings within normal limits unless otherwise noted below.   Relevant physical exam findings are noted in the Assessment and Plan.    Assessment & Plan   SKIN CANCER SCREENING PERFORMED TODAY.  ACTINIC DAMAGE - Chronic condition, secondary to cumulative UV/sun exposure - diffuse scaly erythematous macules with underlying dyspigmentation - Recommend daily broad spectrum sunscreen SPF 30+ to sun-exposed areas, reapply every 2 hours as needed.  - Staying in the shade or wearing long sleeves, sun glasses (UVA+UVB protection) and wide brim hats (4-inch brim around the entire circumference of the hat) are also recommended for sun protection.  - Call for new or changing lesions.  LENTIGINES, SEBORRHEIC KERATOSES, HEMANGIOMAS - Benign normal skin lesions - Benign-appearing - Call for any changes  MELANOCYTIC NEVI - Tan-brown and/or pink-flesh-colored symmetric macules and papules - Benign appearing on  exam today - Observation - Call clinic for new or changing moles - Recommend daily use of broad spectrum spf 30+ sunscreen to sun-exposed areas.   PSORIASIS Exam: Well-demarcated erythematous papules/plaques with silvery scale, guttate pink scaly papules.   Chronic and persistent condition with duration or expected duration over one year. Condition is symptomatic/ bothersome to patient. Not currently at goal.   Psoriasis is a chronic non-curable, but treatable genetic/hereditary disease that may have other systemic features affecting other organ systems such as joints (Psoriatic Arthritis). It is associated with an increased risk of inflammatory bowel disease, heart disease, non-alcoholic fatty liver disease, and depression.  Treatments include light and laser treatments; topical medications; and systemic medications including oral and injectables.  Treatment Plan: Continue Tazarotene  cream prn  Start Mometasone cream apply to psoriasis areas once a day prn      HISTORY OF DYSPLASTIC NEVUS Right mid back paraspinal  No evidence of recurrence today Recommend regular full body skin exams Recommend daily broad spectrum sunscreen SPF 30+ to sun-exposed areas, reapply every 2 hours as needed.  Call if any new or changing lesions are noted between office visits     Return in about 1 year (around 03/31/2025) for TBSE, hx of Dysplastic nevus .  IFay Kirks, CMA, am acting as scribe for Alm Rhyme, MD .   Documentation: I have reviewed the above documentation for accuracy and completeness, and I agree with the above.  Alm Rhyme, MD

## 2024-03-31 NOTE — Patient Instructions (Signed)

## 2024-04-01 ENCOUNTER — Encounter: Payer: Self-pay | Admitting: Obstetrics and Gynecology

## 2024-04-03 ENCOUNTER — Ambulatory Visit: Payer: No Typology Code available for payment source | Admitting: Dermatology

## 2024-04-04 ENCOUNTER — Ambulatory Visit: Admitting: Obstetrics and Gynecology

## 2024-04-07 ENCOUNTER — Encounter: Payer: Self-pay | Admitting: Obstetrics and Gynecology

## 2024-04-07 LAB — OPHTHALMOLOGY REPORT-SCANNED

## 2024-04-08 ENCOUNTER — Encounter: Payer: Self-pay | Admitting: Dermatology

## 2024-04-20 NOTE — Progress Notes (Unsigned)
 No chief complaint on file.   HPI:      [name] is a 58 y.o. G2P1010 whose LMP was Patient's last menstrual period was 11/06/2014., presents today for her annual examination.  Her menses are absent due to menopause. She does not have PMB. She does have vasomotor sx day and night.   Sex activity: single partner, contraception - post menopausal status. She does not have vaginal dryness occas.   Last Pap: 02/07/22  Results were: no abnormalities /neg HPV DNA. Hx of STDs: none  Last mammogram: 03/05/24 at [facility], Results were: normal--routine follow-up in 12 months There is a FH of breast cancer in her pat cousin. There is a FH of ovarian cancer in her cousin and possibly mat aunt. Vistaseq testing was neg 2018. The patient does  self-breast exams.  Colonoscopy: colonoscopy 1/25 and ????10/19 with polyps, hx of polyps on prior colonoscopy too. Repeat due after 5 yrs per [facility] (pt confirmed with them); has appt 1/25. ?????  Tobacco use: The patient denies current or previous tobacco use. Alcohol use: none  Drug use: none Exercise: min active  She does get adequate calcium and Vitamin D  in her diet.  She has HTN and is doing well on lisinopril /hydrochlorothiazide , no side effects. She has type 2 DM, HgA1C 9/24 was 6.4%, recheck due 2/25. Taking metformin  500 mg QD. She gets eye exams yearly. Lipids good on pravastatin  20 mg daily 9/24. No side effects with meds.   Needs RF on xyzal  and flonase  prn for allergies. Knee pain improved with gel and steroid injections.     Past Medical History:  Diagnosis Date   Abnormal mammogram 2012   resolved   Arthritis    Atypical mole 02/2020   right mid back paraspinal   Complication of anesthesia    Diabetes mellitus without complication (HCC)    Family history of adverse reaction to anesthesia    mom-hard to wake up, n/v   Family history of ovarian cancer    Fundic gland polyps of stomach, benign    Glaucoma    History of mammogram  01/27/2016   Birads 1   History of Papanicolaou smear of cervix 01/17/2016   NIL/NEG   Hypertension    IBS (irritable bowel syndrome)    Mixed hyperlipidemia    Osteoarthritis    PONV (postoperative nausea and vomiting)    n/v   Rhinitis, allergic    Sleep apnea    does not use CPAP   Testing of female for genetic disease carrier status 02/2016   VISTASEQ Neg   Torn meniscus    RIGHT KNEE-PT STATES IT HAS HEALED BUT STILL HAS WEAKNESS AND PAIN IN KNEE   Type 2 diabetes mellitus (HCC)     Past Surgical History:  Procedure Laterality Date   CARPAL TUNNEL RELEASE Left 11/23/2022   Procedure: CARPAL TUNNEL RELEASE;  Surgeon: Cleotilde Barrio, MD;  Location: [facility] ORS;  Service: Orthopedics;  Laterality: Left;   COLONOSCOPY  07/2009;2014   benign polyps neg   COLONOSCOPY WITH PROPOFOL  N/A 03/11/2018   repeat after 5 yrs due to polyp; Procedure: COLONOSCOPY WITH PROPOFOL ;  Surgeon: Viktoria Lamar DASEN, MD;  Location: [facility] ENDOSCOPY;  Service: Endoscopy;  Laterality: N/A;   COLONOSCOPY WITH PROPOFOL  N/A 06/29/2023   Procedure: COLONOSCOPY WITH PROPOFOL ;  Surgeon: Maryruth Ole DASEN, MD;  Location: [facility] ENDOSCOPY;  Service: Endoscopy;  Laterality: N/A;   DILATION AND CURETTAGE OF UTERUS     ESOPHAGOGASTRODUODENOSCOPY N/A 11/06/2014  Procedure: ESOPHAGOGASTRODUODENOSCOPY (EGD);  Surgeon: Donnice Vaughn Manes, MD;  Location: [facility] ENDOSCOPY;  Service: Endoscopy;  Laterality: N/A;  With Small Bowel Biopsies   GANGLION CYST EXCISION Left    POLYPECTOMY  06/29/2023   Procedure: POLYPECTOMY;  Surgeon: Maryruth Ole DASEN, MD;  Location: [facility] ENDOSCOPY;  Service: Endoscopy;;    Family History  Problem Relation Age of Onset   Hypertension Mother    Diabetes Father    Heart disease Father    Hypertension Father    Breast cancer Cousin 34   Ovarian cancer Maternal Aunt 42   Ovarian cancer Cousin 68    Social History   Socioeconomic History   Marital status: Married    Spouse name: Not on  file   Number of children: Not on file   Years of education: Not on file   Highest education level: Not on file  Occupational History   Not on file  Tobacco Use   Smoking status: Never   Smokeless tobacco: Never  Vaping Use   Vaping status: Never Used  Substance and Sexual Activity   Alcohol use: Yes    Comment: occ   Drug use: No   Sexual activity: Yes    Birth control/protection: Post-menopausal  Other Topics Concern   Not on file  Social History Narrative   Not on file   Social Drivers of Health   Financial Resource Strain: Not on file  Food Insecurity: Not on file  Transportation Needs: Not on file  Physical Activity: Not on file  Stress: Not on file  Social Connections: Not on file  Intimate Partner Violence: Not on file     Current Outpatient Medications:    Cholecalciferol 50 MCG (2000 UT) CAPS, Take 1 capsule by mouth daily., Disp: , Rfl:    fluticasone  (FLONASE ) 50 MCG/ACT nasal spray, USE 2 SPRAYS IN EACH  NOSTRIL DAILY AS NEEDED FOR ALLERGIES OR RHINITIS, Disp: 48 g, Rfl: 3   gabapentin  (NEURONTIN ) 400 MG capsule, Take 1 capsule (400 mg total) by mouth 3 (three) times daily., Disp: 60 capsule, Rfl: 3   glucose blood test strip, OneTouch Verio strips, Disp: , Rfl:    levocetirizine (XYZAL ) 5 MG tablet, Take 1 tablet (5 mg total) by mouth every evening., Disp: 90 tablet, Rfl: 3   lisinopril -hydrochlorothiazide  (ZESTORETIC ) 20-12.5 MG tablet, Take 1 tablet by mouth daily., Disp: 90 tablet, Rfl: 3   meloxicam  (MOBIC ) 15 MG tablet, Take 1 tablet (15 mg total) by mouth daily., Disp: 30 tablet, Rfl: 3   metFORMIN  (GLUCOPHAGE ) 500 MG tablet, Take 1 tablet (500 mg total) by mouth every evening., Disp: 90 tablet, Rfl: 1   mometasone (ELOCON) 0.1 % cream, Apply psoriasis areas on skin qd-bid up to 5 days a week prn, Disp: 45 g, Rfl: 11   ONETOUCH DELICA LANCETS 33G MISC, OneTouch Delica Lancets 33 gauge, Disp: , Rfl:    pravastatin  (PRAVACHOL ) 20 MG tablet, Take 1 tablet  (20 mg total) by mouth every evening., Disp: 90 tablet, Rfl: 3   pyridOXINE (VITAMIN B6) 25 MG tablet, Take by mouth., Disp: , Rfl:    tazarotene  (TAZORAC ) 0.1 % cream, Apply topically at bedtime., Disp: 30 g, Rfl: 11   triamcinolone  cream (KENALOG ) 0.1 %, Apply 1 Application topically 2 (two) times daily as needed., Disp: , Rfl:    ROS:  Review of Systems  Constitutional:  Negative for fatigue, fever and unexpected weight change.  Respiratory:  Negative for cough, shortness of breath and wheezing.  Cardiovascular:  Negative for chest pain, palpitations and leg swelling.  Gastrointestinal:  Negative for abdominal pain, blood in stool, constipation, diarrhea, nausea and vomiting.  Endocrine: Negative for cold intolerance, heat intolerance and polyuria.  Genitourinary:  Negative for dyspareunia, dysuria, flank pain, frequency, genital sores, hematuria, menstrual problem, pelvic pain, urgency, vaginal bleeding, vaginal discharge and vaginal pain.  Musculoskeletal:  Positive for arthralgias and joint swelling. Negative for back pain and myalgias.  Skin:  Negative for rash.  Neurological:  Negative for dizziness, syncope, light-headedness, numbness and headaches.  Hematological:  Negative for adenopathy.  Psychiatric/Behavioral:  Negative for agitation, confusion, sleep disturbance and suicidal ideas. The patient is not nervous/anxious.     Objective: LMP 11/06/2014    Physical Exam Constitutional:      Appearance: She is well-developed.  Genitourinary:     Vulva normal.     Right Labia: No rash, tenderness or lesions.    Left Labia: No tenderness, lesions or rash.    No vaginal discharge, erythema or tenderness.      Right Adnexa: not tender and no mass present.    Left Adnexa: not tender and no mass present.    No cervical motion tenderness, friability or polyp.     Uterus is not enlarged or tender.  Breasts:    Right: No mass, nipple discharge, skin change or tenderness.      Left: No mass, nipple discharge, skin change or tenderness.  Neck:     Thyroid: No thyromegaly.  Cardiovascular:     Rate and Rhythm: Normal rate and regular rhythm.     Heart sounds: Normal heart sounds. No murmur heard. Pulmonary:     Effort: Pulmonary effort is normal.     Breath sounds: Normal breath sounds.  Abdominal:     Palpations: Abdomen is soft.     Tenderness: There is no abdominal tenderness. There is no guarding or rebound.  Musculoskeletal:        General: Normal range of motion.     Cervical back: Normal range of motion.  Lymphadenopathy:     Cervical: No cervical adenopathy.  Neurological:     General: No focal deficit present.     Mental Status: She is alert and oriented to person, place, and time.     Cranial Nerves: No cranial nerve deficit.  Skin:    General: Skin is warm and dry.  Psychiatric:        Mood and Affect: Mood normal.        Behavior: Behavior normal.        Thought Content: Thought content normal.        Judgment: Judgment normal.  Vitals reviewed.     Assessment/Plan:  Encounter for annual routine gynecological examination  Encounter for screening mammogram for malignant neoplasm of breast; pt has mammo appt  Essential hypertension - Plan: Comprehensive metabolic panel, lisinopril -hydrochlorothiazide  (ZESTORETIC ) 20-12.5 MG tablet; Rx RF. Doing well, no side effects.   Type 2 diabetes mellitus without complication, without long-term current use of insulin (HCC) - Plan: Hemoglobin A1c, lisinopril -hydrochlorothiazide  (ZESTORETIC ) 20-12.5 MG tablet, metFORMIN  (GLUCOPHAGE ) 500 MG tablet, pravastatin  (PRAVACHOL ) 20 MG tablet; Rx RF metformin , recheck in 6 months, orders placed.   Allergic rhinitis, unspecified seasonality, unspecified trigger - Plan: fluticasone  (FLONASE ) 50 MCG/ACT nasal spray, levocetirizine (XYZAL ) 5 MG tablet; Rx RF to mail order  Mixed hyperlipidemia - Diet/exercise/wt loss - Plan. Rx RF eRxd, diet/exercise changes for  wt loss.    No orders of the defined types were  placed in this encounter.         GYN counsel mammography screening, adequate intake of calcium and vitamin D , diet and exercise    F/U  No follow-ups on file.  Corbyn Wildey B. Zackariah Vanderpol, PA-C 04/20/2024 4:24 PM

## 2024-04-21 ENCOUNTER — Encounter: Payer: Self-pay | Admitting: Obstetrics and Gynecology

## 2024-04-21 ENCOUNTER — Ambulatory Visit: Admitting: Obstetrics and Gynecology

## 2024-04-21 VITALS — BP 122/75 | HR 76 | Ht 61.0 in | Wt 181.0 lb

## 2024-04-21 DIAGNOSIS — Z01419 Encounter for gynecological examination (general) (routine) without abnormal findings: Secondary | ICD-10-CM | POA: Diagnosis not present

## 2024-04-21 DIAGNOSIS — Z1211 Encounter for screening for malignant neoplasm of colon: Secondary | ICD-10-CM

## 2024-04-21 DIAGNOSIS — Z1231 Encounter for screening mammogram for malignant neoplasm of breast: Secondary | ICD-10-CM

## 2024-04-21 DIAGNOSIS — I10 Essential (primary) hypertension: Secondary | ICD-10-CM

## 2024-04-21 DIAGNOSIS — E119 Type 2 diabetes mellitus without complications: Secondary | ICD-10-CM

## 2024-04-21 NOTE — Patient Instructions (Signed)
 I value your feedback and you entrusting [place] with your care. If you get a [place] patient survey, I would appreciate you taking the time to let us know about your experience today. Thank you! ? ? ?

## 2025-03-31 ENCOUNTER — Ambulatory Visit: Admitting: Dermatology
